# Patient Record
Sex: Female | Born: 1965
Health system: Southern US, Community
[De-identification: ages and names within clinical notes are randomized; demographics above are authoritative.]

## PROBLEM LIST (undated history)

## (undated) DIAGNOSIS — Z9189 Other specified personal risk factors, not elsewhere classified: Secondary | ICD-10-CM

## (undated) DIAGNOSIS — H8109 Meniere's disease, unspecified ear: Secondary | ICD-10-CM

## (undated) DIAGNOSIS — E785 Hyperlipidemia, unspecified: Secondary | ICD-10-CM

## (undated) HISTORY — DX: Other specified personal risk factors, not elsewhere classified: Z91.89

## (undated) HISTORY — PX: BUNIONECTOMY: SHX129

## (undated) HISTORY — DX: Meniere's disease, unspecified ear: H81.09

## (undated) HISTORY — DX: Hyperlipidemia, unspecified: E78.5

## (undated) HISTORY — PX: CERVICAL CERCLAGE: SHX1329

---

## 1984-03-02 HISTORY — PX: OVARY SURGERY: SHX727

## 1997-08-02 ENCOUNTER — Other Ambulatory Visit: Admission: RE | Admit: 1997-08-02 | Discharge: 1997-08-02 | Payer: Self-pay | Admitting: Obstetrics and Gynecology

## 1998-07-25 ENCOUNTER — Ambulatory Visit (HOSPITAL_BASED_OUTPATIENT_CLINIC_OR_DEPARTMENT_OTHER): Admission: RE | Admit: 1998-07-25 | Discharge: 1998-07-25 | Payer: Self-pay | Admitting: Orthopedic Surgery

## 1998-08-30 ENCOUNTER — Other Ambulatory Visit: Admission: RE | Admit: 1998-08-30 | Discharge: 1998-08-30 | Payer: Self-pay | Admitting: Obstetrics and Gynecology

## 1998-10-31 ENCOUNTER — Ambulatory Visit (HOSPITAL_BASED_OUTPATIENT_CLINIC_OR_DEPARTMENT_OTHER): Admission: RE | Admit: 1998-10-31 | Discharge: 1998-10-31 | Payer: Self-pay | Admitting: Orthopaedic Surgery

## 1999-07-15 ENCOUNTER — Encounter: Payer: Self-pay | Admitting: Obstetrics and Gynecology

## 1999-07-15 ENCOUNTER — Ambulatory Visit (HOSPITAL_COMMUNITY): Admission: RE | Admit: 1999-07-15 | Discharge: 1999-07-15 | Payer: Self-pay | Admitting: Obstetrics and Gynecology

## 1999-09-22 ENCOUNTER — Other Ambulatory Visit: Admission: RE | Admit: 1999-09-22 | Discharge: 1999-09-22 | Payer: Self-pay | Admitting: Obstetrics and Gynecology

## 2000-11-12 ENCOUNTER — Other Ambulatory Visit: Admission: RE | Admit: 2000-11-12 | Discharge: 2000-11-12 | Payer: Self-pay | Admitting: Internal Medicine

## 2005-07-07 ENCOUNTER — Other Ambulatory Visit: Admission: RE | Admit: 2005-07-07 | Discharge: 2005-07-07 | Payer: Self-pay | Admitting: Internal Medicine

## 2005-11-09 ENCOUNTER — Encounter: Admission: RE | Admit: 2005-11-09 | Discharge: 2005-11-09 | Payer: Self-pay | Admitting: Internal Medicine

## 2006-07-23 ENCOUNTER — Other Ambulatory Visit: Admission: RE | Admit: 2006-07-23 | Discharge: 2006-07-23 | Payer: Self-pay | Admitting: Family Medicine

## 2006-11-23 ENCOUNTER — Encounter: Admission: RE | Admit: 2006-11-23 | Discharge: 2006-11-23 | Payer: Self-pay | Admitting: Family Medicine

## 2007-07-27 ENCOUNTER — Other Ambulatory Visit: Admission: RE | Admit: 2007-07-27 | Discharge: 2007-07-27 | Payer: Self-pay | Admitting: Family Medicine

## 2008-06-18 ENCOUNTER — Encounter: Admission: RE | Admit: 2008-06-18 | Discharge: 2008-06-18 | Payer: Self-pay | Admitting: Family Medicine

## 2008-08-03 ENCOUNTER — Other Ambulatory Visit: Admission: RE | Admit: 2008-08-03 | Discharge: 2008-08-03 | Payer: Self-pay | Admitting: Family Medicine

## 2009-11-15 ENCOUNTER — Encounter: Admission: RE | Admit: 2009-11-15 | Discharge: 2009-11-15 | Payer: Self-pay | Admitting: Family Medicine

## 2009-12-25 ENCOUNTER — Other Ambulatory Visit: Admission: RE | Admit: 2009-12-25 | Discharge: 2009-12-25 | Payer: Self-pay | Admitting: Family Medicine

## 2011-04-07 ENCOUNTER — Other Ambulatory Visit: Payer: Self-pay | Admitting: Family Medicine

## 2011-04-07 DIAGNOSIS — Z1231 Encounter for screening mammogram for malignant neoplasm of breast: Secondary | ICD-10-CM

## 2011-04-17 ENCOUNTER — Ambulatory Visit
Admission: RE | Admit: 2011-04-17 | Discharge: 2011-04-17 | Disposition: A | Discharge status: Home / Self Care | Payer: BC Managed Care – PPO | Source: Ambulatory Visit | Attending: Family Medicine | Admitting: Family Medicine

## 2011-04-17 DIAGNOSIS — Z1231 Encounter for screening mammogram for malignant neoplasm of breast: Secondary | ICD-10-CM

## 2011-07-17 ENCOUNTER — Other Ambulatory Visit: Payer: Self-pay | Admitting: Family Medicine

## 2011-07-17 ENCOUNTER — Other Ambulatory Visit (HOSPITAL_COMMUNITY)
Admission: RE | Admit: 2011-07-17 | Discharge: 2011-07-17 | Disposition: A | Discharge status: Home / Self Care | Payer: BC Managed Care – PPO | Source: Ambulatory Visit | Attending: Family Medicine | Admitting: Family Medicine

## 2011-07-17 DIAGNOSIS — Z124 Encounter for screening for malignant neoplasm of cervix: Secondary | ICD-10-CM | POA: Insufficient documentation

## 2012-09-27 ENCOUNTER — Other Ambulatory Visit: Payer: Self-pay

## 2012-09-27 DIAGNOSIS — Z1231 Encounter for screening mammogram for malignant neoplasm of breast: Secondary | ICD-10-CM

## 2012-10-19 ENCOUNTER — Ambulatory Visit
Admission: RE | Admit: 2012-10-19 | Discharge: 2012-10-19 | Disposition: A | Discharge status: Home / Self Care | Payer: BC Managed Care – PPO | Source: Ambulatory Visit

## 2012-10-19 DIAGNOSIS — Z1231 Encounter for screening mammogram for malignant neoplasm of breast: Secondary | ICD-10-CM

## 2014-09-19 ENCOUNTER — Ambulatory Visit (INDEPENDENT_AMBULATORY_CARE_PROVIDER_SITE_OTHER): Payer: BLUE CROSS/BLUE SHIELD | Admitting: Family Medicine

## 2014-09-19 ENCOUNTER — Encounter: Payer: Self-pay | Admitting: Family Medicine

## 2014-09-19 ENCOUNTER — Encounter: Payer: Self-pay | Admitting: Internal Medicine

## 2014-09-19 ENCOUNTER — Encounter (INDEPENDENT_AMBULATORY_CARE_PROVIDER_SITE_OTHER): Payer: Self-pay

## 2014-09-19 VITALS — BP 114/68 | HR 72 | Temp 97.8°F | Ht 64.0 in | Wt 173.5 lb

## 2014-09-19 DIAGNOSIS — Z1211 Encounter for screening for malignant neoplasm of colon: Secondary | ICD-10-CM

## 2014-09-19 DIAGNOSIS — K5900 Constipation, unspecified: Secondary | ICD-10-CM | POA: Insufficient documentation

## 2014-09-19 DIAGNOSIS — Z0000 Encounter for general adult medical examination without abnormal findings: Secondary | ICD-10-CM | POA: Insufficient documentation

## 2014-09-19 DIAGNOSIS — Z1283 Encounter for screening for malignant neoplasm of skin: Secondary | ICD-10-CM

## 2014-09-19 DIAGNOSIS — H8109 Meniere's disease, unspecified ear: Secondary | ICD-10-CM | POA: Insufficient documentation

## 2014-09-19 DIAGNOSIS — H8102 Meniere's disease, left ear: Secondary | ICD-10-CM

## 2014-09-19 DIAGNOSIS — Z1239 Encounter for other screening for malignant neoplasm of breast: Secondary | ICD-10-CM

## 2014-09-19 DIAGNOSIS — Z9189 Other specified personal risk factors, not elsewhere classified: Secondary | ICD-10-CM | POA: Insufficient documentation

## 2014-09-19 DIAGNOSIS — H8103 Meniere's disease, bilateral: Secondary | ICD-10-CM | POA: Insufficient documentation

## 2014-09-19 LAB — LIPID PANEL
Cholesterol: 248 mg/dL — ABNORMAL HIGH (ref 0–200)
HDL: 56.3 mg/dL (ref >39.00)
LDL Cholesterol: 165 mg/dL — ABNORMAL HIGH (ref 0–99)
NonHDL: 191.7
Total CHOL/HDL Ratio: 4
Triglycerides: 134 mg/dL (ref 0.0–149.0)
VLDL: 26.8 mg/dL (ref 0.0–40.0)

## 2014-09-19 LAB — CBC WITH DIFFERENTIAL/PLATELET
Basophils Absolute: 0 10*3/uL (ref 0.0–0.1)
Basophils Relative: 0.7 % (ref 0.0–3.0)
Eosinophils Absolute: 0.1 10*3/uL (ref 0.0–0.7)
Eosinophils Relative: 1.4 % (ref 0.0–5.0)
HCT: 41.3 % (ref 36.0–46.0)
Hemoglobin: 13.9 g/dL (ref 12.0–15.0)
Lymphocytes Relative: 39.4 % (ref 12.0–46.0)
Lymphs Abs: 2 10*3/uL (ref 0.7–4.0)
MCHC: 33.7 g/dL (ref 30.0–36.0)
MCV: 88 fl (ref 78.0–100.0)
Monocytes Absolute: 0.4 10*3/uL (ref 0.1–1.0)
Monocytes Relative: 8.4 % (ref 3.0–12.0)
Neutro Abs: 2.5 10*3/uL (ref 1.4–7.7)
Neutrophils Relative %: 50.1 % (ref 43.0–77.0)
Platelets: 217 10*3/uL (ref 150.0–400.0)
RBC: 4.7 Mil/uL (ref 3.87–5.11)
RDW: 13.4 % (ref 11.5–15.5)
WBC: 5 10*3/uL (ref 4.0–10.5)

## 2014-09-19 LAB — COMPREHENSIVE METABOLIC PANEL
ALT: 12 U/L (ref 0–35)
AST: 18 U/L (ref 0–37)
Albumin: 4.6 g/dL (ref 3.5–5.2)
Alkaline Phosphatase: 68 U/L (ref 39–117)
BUN: 12 mg/dL (ref 6–23)
CO2: 30 mEq/L (ref 19–32)
Calcium: 9.8 mg/dL (ref 8.4–10.5)
Chloride: 102 mEq/L (ref 96–112)
Creatinine, Ser: 0.79 mg/dL (ref 0.40–1.20)
GFR: 81.38 mL/min (ref >60.00)
Glucose, Bld: 68 mg/dL — ABNORMAL LOW (ref 70–99)
Potassium: 4.3 mEq/L (ref 3.5–5.1)
Sodium: 139 mEq/L (ref 135–145)
Total Bilirubin: 0.6 mg/dL (ref 0.2–1.2)
Total Protein: 7.4 g/dL (ref 6.0–8.3)

## 2014-09-19 LAB — TSH: TSH: 1.31 u[IU]/mL (ref 0.35–4.50)

## 2014-09-19 NOTE — Progress Notes (Signed)
Subjective:   Patient ID: Tonya Anderson, female    DOB: 03/20/1965, 49 y.o.   MRN: [id number]  Tonya Anderson is a pleasant 49 y.o. year old female who presents to clinic today with Establish Care  on 09/19/2014  HPI:  G2P2 Would like CPX today.  LMP 5 years ago- no h/o post menopausal bleeding.  No family history of breast or uterine cancer. Has never had a colonoscopy.  Denies changes in her bowel habits or blood in her stool.  Menire's disease- takes a diuretic twice weekly to help with fluid build up behind her left ear and dizzy spells.  Denies any recent dizzy spells or syncope.  Physically active- [facility] that [facility]- bikes, swims, walks. No current outpatient prescriptions on file prior to visit.   No current facility-administered medications on file prior to visit.    Allergies  Allergen Reactions  . Ancef [Cefazolin] Anaphylaxis    Past Medical History  Diagnosis Date  . Arthritis   . Blood in stool   . History of fainting spells of unknown cause   . Constipation   . Hemorrhoids     Past Surgical History  Procedure Laterality Date  . Cervical cerclage  1994 and 1995  . Ovary surgery  1986    Family History  Problem Relation Age of Onset  . Arthritis Mother   . Hyperlipidemia Father   . Depression Father   . Arthritis Maternal Grandmother   . Heart disease Paternal Grandfather     History   Social History  . Marital Status: Married    Spouse Name: N/A  . Number of Children: N/A  . Years of Education: N/A   Occupational History  . Not on file.   Social History Main Topics  . Smoking status: Never Smoker   . Smokeless tobacco: Never Used  . Alcohol Use: Yes  . Drug Use: No  . Sexual Activity: Yes     Comment: husband has vasectomy   Other Topics Concern  . 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    Review of Systems  Constitutional: Negative.   HENT: Negative.   Eyes: Negative.   Respiratory: Negative.   Cardiovascular: Negative.   Gastrointestinal: Negative.   Endocrine: Negative.   Genitourinary: Negative.   Musculoskeletal: Negative.   Skin: Negative.   Allergic/Immunologic: Negative.   Neurological: Negative.   Hematological: Negative.   Psychiatric/Behavioral: Negative.   All other systems reviewed and are negative.      Objective:    BP 114/68 mmHg  Pulse 72  Temp(Src) 97.8 F (36.6 C) (Oral)  Ht  (1.626 m)  Wt 173 lb 8 oz (78.699 kg)  BMI 29.77 kg/m2  SpO2 97%   Physical Exam    General:  Well-developed,well-nourished,in no acute distress; alert,appropriate and cooperative throughout examination Head:  normocephalic and atraumatic.   Eyes:  vision grossly intact, pupils equal, pupils round, and pupils reactive to light.   Ears:  R ear normal and L ear normal.   Nose:  no external deformity.   Mouth:  good dentition.   Neck:  No deformities, masses, or tenderness noted. Breasts:  No mass, nodules, thickening, tenderness, bulging, retraction, inflamation, nipple discharge or skin changes noted.   Lungs:  Normal respiratory effort, chest expands symmetrically.  Lungs are clear to auscultation, no crackles or wheezes. Heart:  Normal rate and regular rhythm. S1 and S2 normal without gallop, murmur, click, rub or other extra sounds. Abdomen:  Bowel sounds positive,abdomen soft and non-tender without masses, organomegaly or hernias noted. Msk:  No deformity or scoliosis noted of thoracic or lumbar spine.   Extremities:  No clubbing, cyanosis, edema, or deformity noted with normal full range of motion of all joints.   Neurologic:  alert & oriented X3 and gait normal.   Skin:  Intact without suspicious lesions or rashes Cervical Nodes:  No lymphadenopathy noted Axillary Nodes:  No  palpable lymphadenopathy Psych:  Cognition and judgment appear intact. Alert and cooperative with normal attention span and concentration. No apparent delusions, illusions, hallucinations      Assessment & Plan:   Well woman exam (no gynecological exam) - Plan: CBC with Differential/Platelet, Comprehensive metabolic panel, Lipid panel, TSH  DES exposure in utero  Meniere's disease, left  Constipation, unspecified constipation type  Special screening for malignant neoplasms, colon - Plan: Ambulatory referral to Gastroenterology  Screening for skin cancer - Plan: Ambulatory referral to Dermatology  Screening for breast cancer - Plan: MM Digital Screening No Follow-up on file.

## 2014-09-19 NOTE — Patient Instructions (Signed)
It was really nice to meet you. We will call you with your results and you can view them online.  Please call to set up your mammogram.  Please stop by to see Shirlee LimerickMarion on your way out.

## 2014-09-19 NOTE — Progress Notes (Signed)
Pre visit review using our clinic review tool, if applicable. No additional management support is needed unless otherwise documented below in the visit note. 

## 2014-09-19 NOTE — Assessment & Plan Note (Signed)
Reviewed preventive care protocols, scheduled due services, and updated immunizations Discussed nutrition, exercise, diet, and healthy lifestyle.  Orders Placed This Encounter  Procedures  . MM Digital Screening  . CBC with Differential/Platelet  . Comprehensive metabolic panel  . Lipid panel  . TSH  . Ambulatory referral to Gastroenterology  . Ambulatory referral to Dermatology

## 2014-09-26 ENCOUNTER — Other Ambulatory Visit: Payer: Self-pay | Admitting: Family Medicine

## 2014-09-26 MED ORDER — ATORVASTATIN CALCIUM 10 MG PO TABS: 10.0000 mg | ORAL_TABLET | Freq: Every day | ORAL | Status: DC

## 2014-10-30 ENCOUNTER — Ambulatory Visit
Admission: RE | Admit: 2014-10-30 | Discharge: 2014-10-30 | Disposition: A | Discharge status: Home / Self Care | Payer: BLUE CROSS/BLUE SHIELD | Source: Ambulatory Visit | Attending: Family Medicine | Admitting: Family Medicine

## 2014-10-30 DIAGNOSIS — Z1239 Encounter for other screening for malignant neoplasm of breast: Secondary | ICD-10-CM

## 2014-11-06 ENCOUNTER — Encounter: Payer: BLUE CROSS/BLUE SHIELD | Admitting: Internal Medicine

## 2014-11-27 ENCOUNTER — Other Ambulatory Visit: Payer: Self-pay | Admitting: Family Medicine

## 2014-11-27 ENCOUNTER — Other Ambulatory Visit (INDEPENDENT_AMBULATORY_CARE_PROVIDER_SITE_OTHER): Payer: BLUE CROSS/BLUE SHIELD

## 2014-11-27 DIAGNOSIS — Z23 Encounter for immunization: Secondary | ICD-10-CM

## 2014-11-27 DIAGNOSIS — Z0000 Encounter for general adult medical examination without abnormal findings: Secondary | ICD-10-CM

## 2014-11-27 LAB — LIPID PANEL
Cholesterol: 193 mg/dL (ref 0–200)
HDL: 61.6 mg/dL (ref >39.00)
LDL Cholesterol: 103 mg/dL — ABNORMAL HIGH (ref 0–99)
NonHDL: 131.41
Total CHOL/HDL Ratio: 3
Triglycerides: 141 mg/dL (ref 0.0–149.0)
VLDL: 28.2 mg/dL (ref 0.0–40.0)

## 2014-11-27 LAB — CBC WITH DIFFERENTIAL/PLATELET
Basophils Absolute: 0 10*3/uL (ref 0.0–0.1)
Basophils Relative: 0.4 % (ref 0.0–3.0)
Eosinophils Absolute: 0.1 10*3/uL (ref 0.0–0.7)
Eosinophils Relative: 1.8 % (ref 0.0–5.0)
HCT: 43.5 % (ref 36.0–46.0)
Hemoglobin: 14.6 g/dL (ref 12.0–15.0)
Lymphocytes Relative: 35.3 % (ref 12.0–46.0)
Lymphs Abs: 2.1 10*3/uL (ref 0.7–4.0)
MCHC: 33.5 g/dL (ref 30.0–36.0)
MCV: 88.8 fl (ref 78.0–100.0)
Monocytes Absolute: 0.6 10*3/uL (ref 0.1–1.0)
Monocytes Relative: 9.5 % (ref 3.0–12.0)
Neutro Abs: 3.1 10*3/uL (ref 1.4–7.7)
Neutrophils Relative %: 53 % (ref 43.0–77.0)
Platelets: 214 10*3/uL (ref 150.0–400.0)
RBC: 4.9 Mil/uL (ref 3.87–5.11)
RDW: 13.1 % (ref 11.5–15.5)
WBC: 5.8 10*3/uL (ref 4.0–10.5)

## 2014-11-27 LAB — COMPREHENSIVE METABOLIC PANEL
ALT: 15 U/L (ref 0–35)
AST: 17 U/L (ref 0–37)
Albumin: 4.7 g/dL (ref 3.5–5.2)
Alkaline Phosphatase: 74 U/L (ref 39–117)
BUN: 11 mg/dL (ref 6–23)
CO2: 32 mEq/L (ref 19–32)
Calcium: 10 mg/dL (ref 8.4–10.5)
Chloride: 101 mEq/L (ref 96–112)
Creatinine, Ser: 0.82 mg/dL (ref 0.40–1.20)
GFR: 77.9 mL/min (ref >60.00)
Glucose, Bld: 98 mg/dL (ref 70–99)
Potassium: 4.4 mEq/L (ref 3.5–5.1)
Sodium: 140 mEq/L (ref 135–145)
Total Bilirubin: 0.7 mg/dL (ref 0.2–1.2)
Total Protein: 7.2 g/dL (ref 6.0–8.3)

## 2014-11-27 LAB — TSH: TSH: 1.83 u[IU]/mL (ref 0.35–4.50)

## 2014-11-28 LAB — HIV ANTIBODY (ROUTINE TESTING W REFLEX): HIV 1&2 Ab, 4th Generation: NONREACTIVE

## 2014-11-28 LAB — HEPATITIS C ANTIBODY: HCV Ab: NEGATIVE

## 2014-12-12 ENCOUNTER — Encounter: Payer: BLUE CROSS/BLUE SHIELD | Admitting: Internal Medicine

## 2015-01-04 ENCOUNTER — Ambulatory Visit (AMBULATORY_SURGERY_CENTER): Payer: Self-pay | Admitting: *Deleted

## 2015-01-04 VITALS — Ht 64.0 in | Wt 176.0 lb

## 2015-01-04 DIAGNOSIS — Z1211 Encounter for screening for malignant neoplasm of colon: Secondary | ICD-10-CM

## 2015-01-04 NOTE — Progress Notes (Signed)
No egg or soy allergy. No anesthesia problems.  No home O2.  No diet meds.  

## 2015-01-11 ENCOUNTER — Encounter: Payer: Self-pay | Admitting: Internal Medicine

## 2015-01-16 ENCOUNTER — Telehealth: Payer: Self-pay | Admitting: Family Medicine

## 2015-01-16 NOTE — Telephone Encounter (Signed)
Labs indicate they were to be discussed at CPE. Spoke to pt who states she had CPE in July, and that these were repeat labs that she had not be advised on. Apologized and pt verbally expressed understanding

## 2015-01-16 NOTE — Telephone Encounter (Signed)
Pt came in 11/27/2014 for lab work and has not heard back from anyone w/results.  She was supposed to have her lipids checked after being on Lipitor and wants to know if when it's time to refill it, does she stay w/the same dosage, etc.  Pt is requesting c/b. Thank you

## 2015-01-16 NOTE — Telephone Encounter (Signed)
Please apologize to pt.  Her cholesterol looked great.  Yes, continue same dosage.

## 2015-01-21 ENCOUNTER — Encounter: Payer: Self-pay | Admitting: Internal Medicine

## 2015-01-21 ENCOUNTER — Ambulatory Visit (AMBULATORY_SURGERY_CENTER): Payer: BLUE CROSS/BLUE SHIELD | Admitting: Internal Medicine

## 2015-01-21 VITALS — BP 115/84 | HR 56 | Temp 96.7°F | Resp 22 | Ht 64.0 in | Wt 176.0 lb

## 2015-01-21 DIAGNOSIS — Z1211 Encounter for screening for malignant neoplasm of colon: Secondary | ICD-10-CM

## 2015-01-21 MED ORDER — SODIUM CHLORIDE 0.9 % IV SOLN: 500.0000 mL | INTRAVENOUS | Status: DC

## 2015-01-21 NOTE — Op Note (Signed)
[facility] [address] [place], [id number]   COLONOSCOPY PROCEDURE REPORT  PATIENT: Tonya Anderson, Tonya Anderson  MR#: [id number] BIRTHDATE: 24-Jun-1965 , 49  yrs. old GENDER: female ENDOSCOPIST: Iva Booparl E Gessner, MD, [facility] PROCEDURE DATE:  01/21/2015 PROCEDURE:   Colonoscopy, screening First Screening Colonoscopy - Avg.  risk and is 50 yrs.  old or older Yes.  Prior Negative Screening - Now for repeat screening. N/A  History of Adenoma - Now for follow-up colonoscopy & has been > or = to 3 yrs.  N/A  Polyps removed today? No Recommend repeat exam, <10 yrs? No ASA CLASS:   Class II INDICATIONS:Screening for colonic neoplasia and Colorectal Neoplasm Risk Assessment for this procedure is average risk. MEDICATIONS: Propofol 200 mg IV and Monitored anesthesia care  DESCRIPTION OF PROCEDURE:   After the risks benefits and alternatives of the procedure were thoroughly explained, informed consent was obtained.  The digital rectal exam revealed no abnormalities of the rectum.   The LB CF-H180AL Loaner [id number] endoscope was introduced through the anus and advanced to the cecum, which was identified by both the appendix and ileocecal valve. No adverse events experienced.   The quality of the prep was excellent.  (MiraLax was used)  The instrument was then slowly withdrawn as the colon was fully examined. Estimated blood loss is zero unless otherwise noted in this procedure report.      COLON FINDINGS: A normal appearing cecum, ileocecal valve, and appendiceal orifice were identified.  The ascending, transverse, descending, sigmoid colon, and rectum appeared unremarkable. Retroflexed views revealed no abnormalities. The time to cecum = 2.2 Withdrawal time = 7.1   The scope was withdrawn and the procedure completed. COMPLICATIONS: There were no immediate complications.  ENDOSCOPIC IMPRESSION: Normal colonoscopy - excellent prep - first screening  RECOMMENDATIONS: Repeat  colonoscopy/screening test in 2026 -  10 years.  eSigned:  Iva Booparl E Gessner, MD, Clement[facility] GrahamFACG 01/21/2015 1:28 PM   cc: Ruthe Mannanalia Aron, MD and The Patient

## 2015-01-21 NOTE — Patient Instructions (Addendum)
 Your colonoscopy was normal.  Next routine colonoscopy/screening test in 10 years - 2026  I appreciate the opportunity to care for you. Dayzee Trower E. Jeffie Spivack, MD, FACG   YOU HAD AN ENDOSCOPIC PROCEDURE TODAY AT THE [facility]:   Refer to the procedure report that was given to you for any specific questions about what was found during the examination.  If the procedure report does not answer your questions, please call your gastroenterologist to clarify.  If you requested that your care partner not be given the details of your procedure findings, then the procedure report has been included in a sealed envelope for you to review at your convenience later.  YOU SHOULD EXPECT: Some feelings of bloating in the abdomen. Passage of more gas than usual.  Walking can help get rid of the air that was put into your GI tract during the procedure and reduce the bloating. If you had a lower endoscopy (such as a colonoscopy or flexible sigmoidoscopy) you may notice spotting of blood in your stool or on the toilet paper. If you underwent a bowel prep for your procedure, you may not have a normal bowel movement for a few days.  Please Note:  You might notice some irritation and congestion in your nose or some drainage.  This is from the oxygen used during your procedure.  There is no need for concern and it should clear up in a day or so.  SYMPTOMS TO REPORT IMMEDIATELY:   Following lower endoscopy (colonoscopy or flexible sigmoidoscopy):  Excessive amounts of blood in the stool  Significant tenderness or worsening of abdominal pains  Swelling of the abdomen that is new, acute  Fever of 100F or higher   For urgent or emergent issues, a gastroenterologist can be reached at any hour by calling [phone].   DIET: Your first meal following the procedure should be a small meal and then it is ok to progress to your normal diet. Heavy or fried foods are harder to digest and may make you  feel nauseous or bloated.  Likewise, meals heavy in dairy and vegetables can increase bloating.  Drink plenty of fluids but you should avoid alcoholic beverages for 24 hours.  ACTIVITY:  You should plan to take it easy for the rest of today and you should NOT DRIVE or use heavy machinery until tomorrow (because of the sedation medicines used during the test).    FOLLOW UP: Our staff will call the number listed on your records the next business day following your procedure to check on you and address any questions or concerns that you may have regarding the information given to you following your procedure. If we do not reach you, we will leave a message.  However, if you are feeling well and you are not experiencing any problems, there is no need to return our call.  We will assume that you have returned to your regular daily activities without incident.  If any biopsies were taken you will be contacted by phone or by letter within the next 1-3 weeks.  Please call us at [phone] if you have not heard about the biopsies in 3 weeks.    SIGNATURES/CONFIDENTIALITY: You and/or your care partner have signed paperwork which will be entered into your electronic medical record.  These signatures attest to the fact that that the information above on your After Visit Summary has been reviewed and is understood.  Full responsibility of the confidentiality of this discharge information   lies with you and/or your care-partner.

## 2015-01-21 NOTE — Progress Notes (Signed)
To recovery, report to [place], RN, VSS 

## 2015-01-22 ENCOUNTER — Telehealth: Payer: Self-pay | Admitting: *Deleted

## 2015-01-22 NOTE — Telephone Encounter (Signed)
Name identifier, left message, follow-up 

## 2015-02-27 ENCOUNTER — Other Ambulatory Visit: Payer: Self-pay | Admitting: Family Medicine

## 2015-04-25 ENCOUNTER — Telehealth: Payer: Self-pay | Admitting: Family Medicine

## 2015-04-25 NOTE — Telephone Encounter (Signed)
Pt need of copy of her immunization [place] going [place] Please call pt when ready for pick up

## 2015-04-25 NOTE — Telephone Encounter (Signed)
Spoke to pt and advised we have not given any immunizations, outside of the flu shot. Pt states she will contact her previous provider at [facility]

## 2015-05-16 ENCOUNTER — Other Ambulatory Visit: Payer: Self-pay | Admitting: Internal Medicine

## 2015-05-16 ENCOUNTER — Telehealth: Payer: Self-pay | Admitting: Family Medicine

## 2015-05-16 MED ORDER — ONDANSETRON HCL 4 MG PO TABS: 4.0000 mg | ORAL_TABLET | Freq: Three times a day (TID) | ORAL | Status: DC | PRN

## 2015-05-16 NOTE — Telephone Encounter (Signed)
zofran sent to pharmacy. 

## 2015-05-16 NOTE — Telephone Encounter (Signed)
[place] to [place].  Pt has had all shots from the health department and got a rx from them for Cipro.  She has now been advised to get a rx for Zofran ODT to take with her to combat nausea from anything she may eat while she is there that may not sit well with her.  [facility] [phone]

## 2015-09-13 ENCOUNTER — Other Ambulatory Visit: Payer: Self-pay | Admitting: Family Medicine

## 2015-09-13 DIAGNOSIS — Z0000 Encounter for general adult medical examination without abnormal findings: Secondary | ICD-10-CM

## 2015-09-18 ENCOUNTER — Ambulatory Visit (INDEPENDENT_AMBULATORY_CARE_PROVIDER_SITE_OTHER): Payer: BLUE CROSS/BLUE SHIELD | Admitting: Primary Care

## 2015-09-18 ENCOUNTER — Encounter: Payer: Self-pay | Admitting: Primary Care

## 2015-09-18 VITALS — BP 118/76 | HR 82 | Temp 98.1°F | Ht 64.0 in | Wt 178.4 lb

## 2015-09-18 DIAGNOSIS — R05 Cough: Secondary | ICD-10-CM | POA: Diagnosis not present

## 2015-09-18 DIAGNOSIS — R059 Cough, unspecified: Secondary | ICD-10-CM

## 2015-09-18 MED ORDER — AZITHROMYCIN 250 MG PO TABS: ORAL_TABLET | ORAL | Status: DC

## 2015-09-18 NOTE — Patient Instructions (Signed)
Start Azithromycin antibiotics. Take 2 tablets by mouth today, then 1 tablet daily for 4 additional days.  Cough/Congestion: Try taking Mucinex DM. This will help loosen up the mucous in your chest. Ensure you take this medication with a full glass of water.  Ensure you are staying hydrated with water.   It was a pleasure meeting you!

## 2015-09-18 NOTE — Progress Notes (Signed)
Subjective:    Patient ID: Tonya Anderson, female    DOB: [birth year], 50 y.o.   MRN: [id number]  HPI  Ms. Tonya Anderson is a 50 year old female who presents today with a chief complaint of cough. She also reports chest congestion, nasal congestion, wheezing, shortness of breath, fatigue. Her cough is productive with green sputum that has improved overall. Denies fevers, rhinorrhea, ear pain, sore throat. Her symptoms have been present persistently for 3-4 weeks. She felt better 2 weeks ago, but then over this past weekend she began noticing fatigue and increased shortness of breath with cough. She's taken Robitussin OTC with temporary improvement. She has been around several friends with the same symptoms.  Review of Systems  Constitutional: Positive for fatigue. Negative for fever.  HENT: Positive for congestion. Negative for ear pain, postnasal drip, sinus pressure and sore throat.   Respiratory: Positive for cough, shortness of breath and wheezing.   Cardiovascular: Negative for chest pain.       Past Medical History  Diagnosis Date  . Arthritis   . Blood in stool   . History of fainting spells of unknown cause   . Constipation   . Hemorrhoids   . Hyperlipidemia   . Meniere disease      Social History   Social History  . Marital Status: Married    Spouse Name: N/A  . Number of Children: N/A  . Years of Education: N/A   Occupational History  . Not on file.   Social History Main Topics  . Smoking status: Never Smoker   . Smokeless tobacco: Never Used  . Alcohol Use: 0.0 oz/week    0 Standard drinks or equivalent per week     Comment:  4-6/week beer/wine/vodka  . Drug Use: No  . Sexual Activity: Yes     Comment: husband has vasectomy   Other Topics Concern  . 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 Cervical cerclage  1994 and 1995  . Ovary surgery  1986    removed cyst and ovary  . Bunionectomy      Family History  Problem Relation Age of Onset  . Arthritis Mother   . Hyperlipidemia Father   . Depression Father   . Arthritis Maternal Grandmother   . Heart disease Paternal Grandfather   . Colon cancer Neg Hx     Allergies  Allergen Reactions  . Ancef [Cefazolin] Anaphylaxis    Current Outpatient Prescriptions on File Prior to Visit  Medication Sig Dispense Refill  . triamterene-hydrochlorothiazide (MAXZIDE-25) 37.5-25 MG per tablet Take 1 tablet by mouth 2 (two) times a week. Reported on 09/18/2015    . atorvastatin (LIPITOR) 10 MG tablet TAKE 1 TABLET BY MOUTH DAILY (Patient not taking: Reported on 09/18/2015) 30 tablet 8   No current facility-administered medications on file prior to visit.    BP 118/76 mmHg  Pulse 82  Temp(Src) 98.1 F (36.7 C) (Oral)  Ht 5\' 4"  (1.626 m)  Wt 178 lb 6.4 oz (80.922 kg)  BMI 30.61 kg/m2  SpO2 97%    Objective:   Physical Exam  Constitutional: She appears well-nourished.  HENT:  Right Ear: Tympanic membrane and ear canal normal.  Left Ear: Tympanic membrane and ear canal normal.  Nose: Right sinus exhibits no maxillary sinus tenderness and no frontal sinus tenderness. Left sinus exhibits no maxillary sinus tenderness and no  frontal sinus tenderness.  Mouth/Throat: Oropharynx is clear and moist.  Eyes: Conjunctivae are normal.  Neck: Neck supple.  Cardiovascular: Normal rate and regular rhythm.   Pulmonary/Chest: Effort normal. She has no wheezes. She has rhonchi in the right upper field, the right lower field, the left upper field and the left lower field.  Lymphadenopathy:    She has no cervical adenopathy.  Skin: Skin is warm and dry.          Assessment & Plan:  Acute Bronchitis:  Cough, chest congestion x 4 weeks. Green sputum production. Now feeling worse with fatigue, shortness of breath. Exam with mild rhonchi throughout lung fields, no wheezing. Doesn't appear toxic,  but does appear to be feeling fatigued. Given duration of symptoms with increased fatigue, will treat for presumed bacterial involvement. Rx for Zpak sent to pharmacy. Discussed use of Mucinex with water consumption. She will follow up with PCP in 1 week for annual physical. Push fluids, return precautions provided.  Morrie Sheldon, NP

## 2015-09-18 NOTE — Progress Notes (Signed)
Pre visit review using our clinic review tool, if applicable. No additional management support is needed unless otherwise documented below in the visit note. 

## 2015-09-23 ENCOUNTER — Other Ambulatory Visit (INDEPENDENT_AMBULATORY_CARE_PROVIDER_SITE_OTHER): Payer: BLUE CROSS/BLUE SHIELD

## 2015-09-23 DIAGNOSIS — Z0000 Encounter for general adult medical examination without abnormal findings: Secondary | ICD-10-CM

## 2015-09-23 LAB — COMPREHENSIVE METABOLIC PANEL
ALT: 14 U/L (ref 0–35)
AST: 17 U/L (ref 0–37)
Albumin: 4.3 g/dL (ref 3.5–5.2)
Alkaline Phosphatase: 71 U/L (ref 39–117)
BUN: 13 mg/dL (ref 6–23)
CO2: 30 mEq/L (ref 19–32)
Calcium: 9.6 mg/dL (ref 8.4–10.5)
Chloride: 104 mEq/L (ref 96–112)
Creatinine, Ser: 0.79 mg/dL (ref 0.40–1.20)
GFR: 81.06 mL/min (ref >60.00)
Glucose, Bld: 88 mg/dL (ref 70–99)
Potassium: 4.4 mEq/L (ref 3.5–5.1)
Sodium: 138 mEq/L (ref 135–145)
Total Bilirubin: 0.5 mg/dL (ref 0.2–1.2)
Total Protein: 7.1 g/dL (ref 6.0–8.3)

## 2015-09-23 LAB — CBC WITH DIFFERENTIAL/PLATELET
Basophils Absolute: 0 10*3/uL (ref 0.0–0.1)
Basophils Relative: 0.6 % (ref 0.0–3.0)
Eosinophils Absolute: 0.1 10*3/uL (ref 0.0–0.7)
Eosinophils Relative: 2.2 % (ref 0.0–5.0)
HCT: 40.5 % (ref 36.0–46.0)
Hemoglobin: 13.6 g/dL (ref 12.0–15.0)
Lymphocytes Relative: 43.1 % (ref 12.0–46.0)
Lymphs Abs: 2.5 10*3/uL (ref 0.7–4.0)
MCHC: 33.7 g/dL (ref 30.0–36.0)
MCV: 87.3 fl (ref 78.0–100.0)
Monocytes Absolute: 0.5 10*3/uL (ref 0.1–1.0)
Monocytes Relative: 8.3 % (ref 3.0–12.0)
Neutro Abs: 2.7 10*3/uL (ref 1.4–7.7)
Neutrophils Relative %: 45.8 % (ref 43.0–77.0)
Platelets: 227 10*3/uL (ref 150.0–400.0)
RBC: 4.63 Mil/uL (ref 3.87–5.11)
RDW: 14 % (ref 11.5–15.5)
WBC: 5.8 10*3/uL (ref 4.0–10.5)

## 2015-09-23 LAB — LIPID PANEL
Cholesterol: 252 mg/dL — ABNORMAL HIGH (ref 0–200)
HDL: 54.5 mg/dL (ref >39.00)
LDL Cholesterol: 171 mg/dL — ABNORMAL HIGH (ref 0–99)
NonHDL: 197.34
Total CHOL/HDL Ratio: 5
Triglycerides: 131 mg/dL (ref 0.0–149.0)
VLDL: 26.2 mg/dL (ref 0.0–40.0)

## 2015-09-23 LAB — TSH: TSH: 1.92 u[IU]/mL (ref 0.35–4.50)

## 2015-09-30 ENCOUNTER — Encounter: Payer: Self-pay | Admitting: Family Medicine

## 2015-09-30 ENCOUNTER — Ambulatory Visit (INDEPENDENT_AMBULATORY_CARE_PROVIDER_SITE_OTHER): Payer: BLUE CROSS/BLUE SHIELD | Admitting: Family Medicine

## 2015-09-30 ENCOUNTER — Other Ambulatory Visit (HOSPITAL_COMMUNITY)
Admission: RE | Admit: 2015-09-30 | Discharge: 2015-09-30 | Disposition: A | Discharge status: Home / Self Care | Payer: BLUE CROSS/BLUE SHIELD | Source: Ambulatory Visit | Attending: Family Medicine | Admitting: Family Medicine

## 2015-09-30 VITALS — BP 116/72 | HR 62 | Temp 97.5°F | Ht 64.0 in | Wt 180.0 lb

## 2015-09-30 DIAGNOSIS — Z01419 Encounter for gynecological examination (general) (routine) without abnormal findings: Secondary | ICD-10-CM | POA: Insufficient documentation

## 2015-09-30 DIAGNOSIS — Z1151 Encounter for screening for human papillomavirus (HPV): Secondary | ICD-10-CM | POA: Insufficient documentation

## 2015-09-30 DIAGNOSIS — E785 Hyperlipidemia, unspecified: Secondary | ICD-10-CM | POA: Diagnosis not present

## 2015-09-30 DIAGNOSIS — Z0000 Encounter for general adult medical examination without abnormal findings: Secondary | ICD-10-CM | POA: Insufficient documentation

## 2015-09-30 MED ORDER — TRIAMTERENE-HCTZ 37.5-25 MG PO TABS: 1.0000 | ORAL_TABLET | ORAL | 2 refills | Status: DC

## 2015-09-30 MED ORDER — ATORVASTATIN CALCIUM 10 MG PO TABS: 10.0000 mg | ORAL_TABLET | Freq: Every day | ORAL | 2 refills | Status: DC

## 2015-09-30 NOTE — Assessment & Plan Note (Signed)
Reviewed preventive care protocols, scheduled due services, and updated immunizations Discussed nutrition, exercise, diet, and healthy lifestyle.  

## 2015-09-30 NOTE — Progress Notes (Signed)
Pre visit review using our clinic review tool, if applicable. No additional management support is needed unless otherwise documented below in the visit note. 

## 2015-09-30 NOTE — Progress Notes (Signed)
Subjective:   Patient ID: Tonya Anderson, female    DOB: 1965/12/26, 50 y.o.   MRN: [id number]  Tonya Anderson is a pleasant 50 y.o. year old female who presents to clinic today with Annual Exam (with pap) and Numbness (R hand)  on 09/30/2015  HPI:  G2P2  LMP 6+ years ago- no h/o post menopausal bleeding. No h/o abnormal pap smears.  Last pap 07/17/11. Mammogram 10/31/14   No family history of breast or uterine cancer. Colonoscopy 01/21/15  Menire's disease- takes a diuretic twice weekly to help with fluid build up behind her left ear and dizzy spells.  Denies any recent dizzy spells or syncope.  Physically active- does [facility] classes at that [facility]- bikes, swims, walks.  HLD- stopped taking lipitor for a month when she had a bad URI.   Lab Results  Component Value Date   CHOL 252 (H) 09/23/2015   HDL 54.50 09/23/2015   LDLCALC 171 (H) 09/23/2015   TRIG 131.0 09/23/2015   CHOLHDL 5 09/23/2015   Lab Results  Component Value Date   ALT 14 09/23/2015   AST 17 09/23/2015   ALKPHOS 71 09/23/2015   BILITOT 0.5 09/23/2015   Lab Results  Component Value Date   NA 138 09/23/2015   K 4.4 09/23/2015   CL 104 09/23/2015   CO2 30 09/23/2015   Lab Results  Component Value Date   TSH 1.92 09/23/2015   Lab Results  Component Value Date   CREATININE 0.79 09/23/2015   Lab Results  Component Value Date   NA 138 09/23/2015   K 4.4 09/23/2015   CL 104 09/23/2015   CO2 30 09/23/2015    No current outpatient prescriptions on file prior to visit.   No current facility-administered medications on file prior to visit.     Allergies  Allergen Reactions  . Ancef [Cefazolin] Anaphylaxis    Past Medical History:  Diagnosis Date  . Arthritis   . Blood in stool   . Constipation   . Hemorrhoids   . History of fainting spells of unknown cause   . Hyperlipidemia   . Meniere disease     Past Surgical History:  Procedure Laterality Date  . BUNIONECTOMY    . CERVICAL  CERCLAGE  1994 and 1995  . OVARY SURGERY  1986   removed cyst and ovary    Family History  Problem Relation Age of Onset  . Arthritis Mother   . Hyperlipidemia Father   . Depression Father   . Arthritis Maternal Grandmother   . Heart disease Paternal Grandfather   . Colon cancer Neg Hx     Social History   Social History  . Marital status: Married    Spouse name: N/A  . Number of children: N/A  . Years of education: N/A   Occupational History  . Not on file.   Social History Main Topics  . Smoking status: Never Smoker  . Smokeless tobacco: Never Used  . Alcohol use 0.0 oz/week     Comment:  4-6/week beer/wine/vodka  . Drug use: No  . Sexual activity: Yes     Comment: husband has vasectomy   Other Topics Concern  . 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    Review of Systems  Constitutional: Negative.   HENT: Negative.  Eyes: Negative.   Respiratory: Negative.   Cardiovascular: Negative.   Gastrointestinal: Negative.   Endocrine: Negative.   Genitourinary: Negative.   Musculoskeletal: Negative.   Skin: Negative.   Allergic/Immunologic: Negative.   Neurological: Negative.   Hematological: Negative.   Psychiatric/Behavioral: Negative.   All other systems reviewed and are negative.      Objective:    BP 116/72   Pulse 62   Temp 97.5 F (36.4 C) (Oral)   Ht  (1.626 m)   Wt 180 lb (81.6 kg)   SpO2 98%   BMI 30.90 kg/m    Physical Exam     General:  Well-developed,well-nourished,in no acute distress; alert,appropriate and cooperative throughout examination Head:  normocephalic and atraumatic.   Eyes:  vision grossly intact, pupils equal, pupils round, and pupils reactive to light.   Ears:  R ear normal and L ear normal.   Nose:  no external  deformity.   Mouth:  good dentition.   Neck:  No deformities, masses, or tenderness noted. Breasts:  No mass, nodules, thickening, tenderness, bulging, retraction, inflamation, nipple discharge or skin changes noted.   Lungs:  Normal respiratory effort, chest expands symmetrically. Lungs are clear to auscultation, no crackles or wheezes. Heart:  Normal rate and regular rhythm. S1 and S2 normal without gallop, murmur, click, rub or other extra sounds. Abdomen:  Bowel sounds positive,abdomen soft and non-tender without masses, organomegaly or hernias noted. Rectal:  no external abnormalities.   Genitalia:  Pelvic Exam:        External: normal female genitalia without lesions or masses        Vagina: normal without lesions or masses        Cervix: normal without lesions or masses        Adnexa: normal bimanual exam without masses or fullness        Uterus: normal by palpation        Pap smear: performed Msk:  No deformity or scoliosis noted of thoracic or lumbar spine.   Extremities:  No clubbing, cyanosis, edema, or deformity noted with normal full range of motion of all joints.   Neurologic:  alert & oriented X3 and gait normal.   Skin:  Intact without suspicious lesions or rashes Cervical Nodes:  No lymphadenopathy noted Axillary Nodes:  No palpable lymphadenopathy Psych:  Cognition and judgment appear intact. Alert and cooperative with normal attention span and concentration. No apparent delusions, illusions, hallucinations     Assessment & Plan:   Well woman exam with routine gynecological exam No Follow-up on file.

## 2015-09-30 NOTE — Assessment & Plan Note (Signed)
Deteriorated off statin. She has since restarted it.

## 2015-10-01 LAB — CYTOLOGY - PAP

## 2015-10-02 ENCOUNTER — Encounter: Payer: Self-pay | Admitting: *Deleted

## 2015-10-28 IMAGING — MG MM DIGITAL SCREENING BILAT W/ CAD
4 series · 4 of 4 positions shown · non-contrast
Comparison: Previous exam(s).

CLINICAL DATA: Screening.

EXAM:
DIGITAL SCREENING BILATERAL MAMMOGRAM WITH CAD

[R CC]
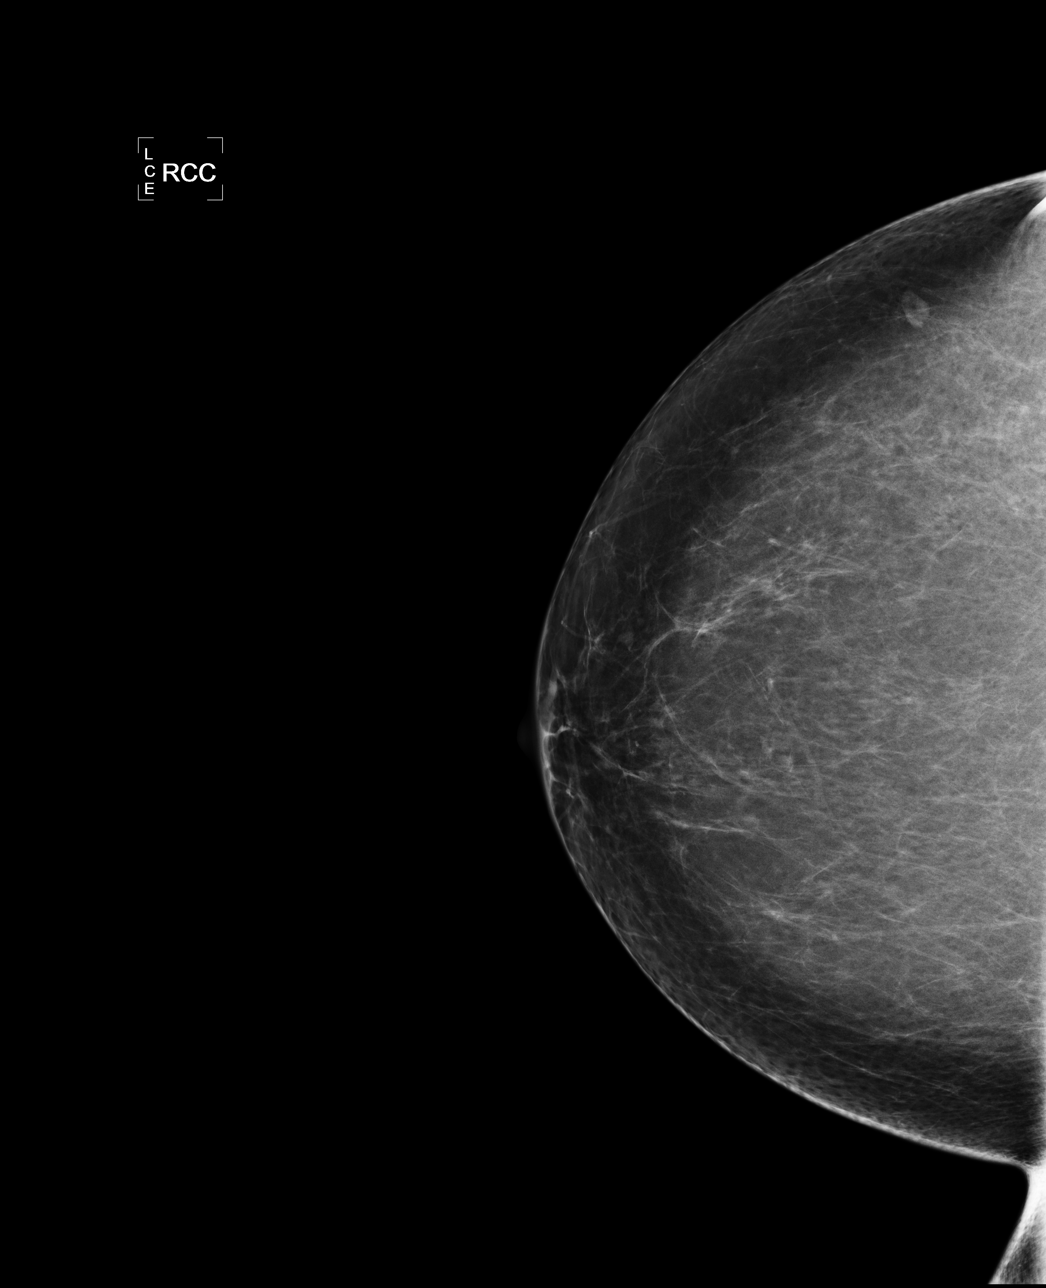

[L CC]
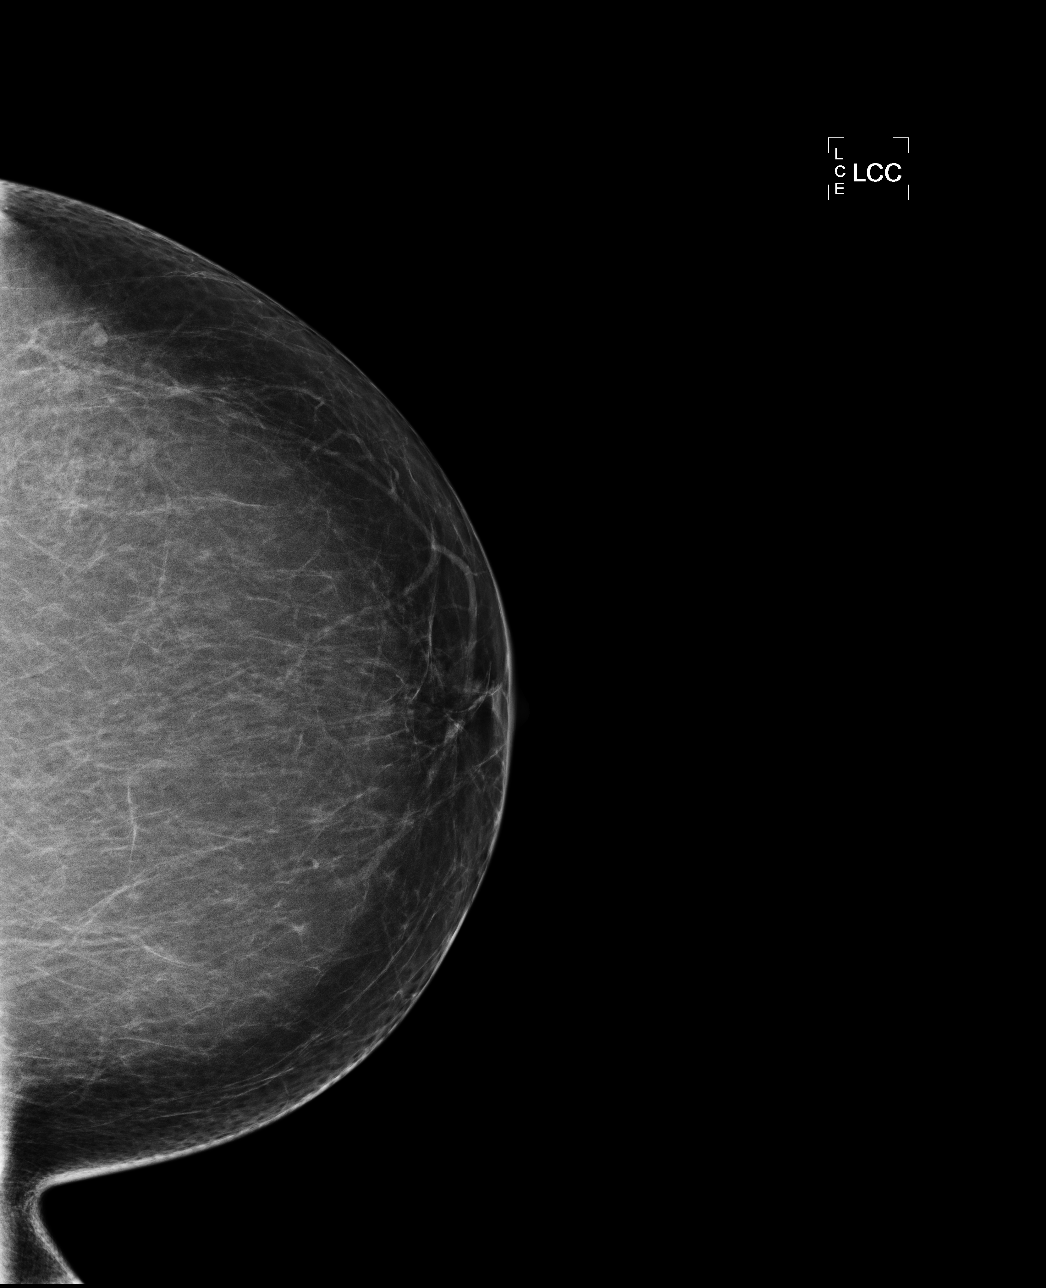

[L MLO]
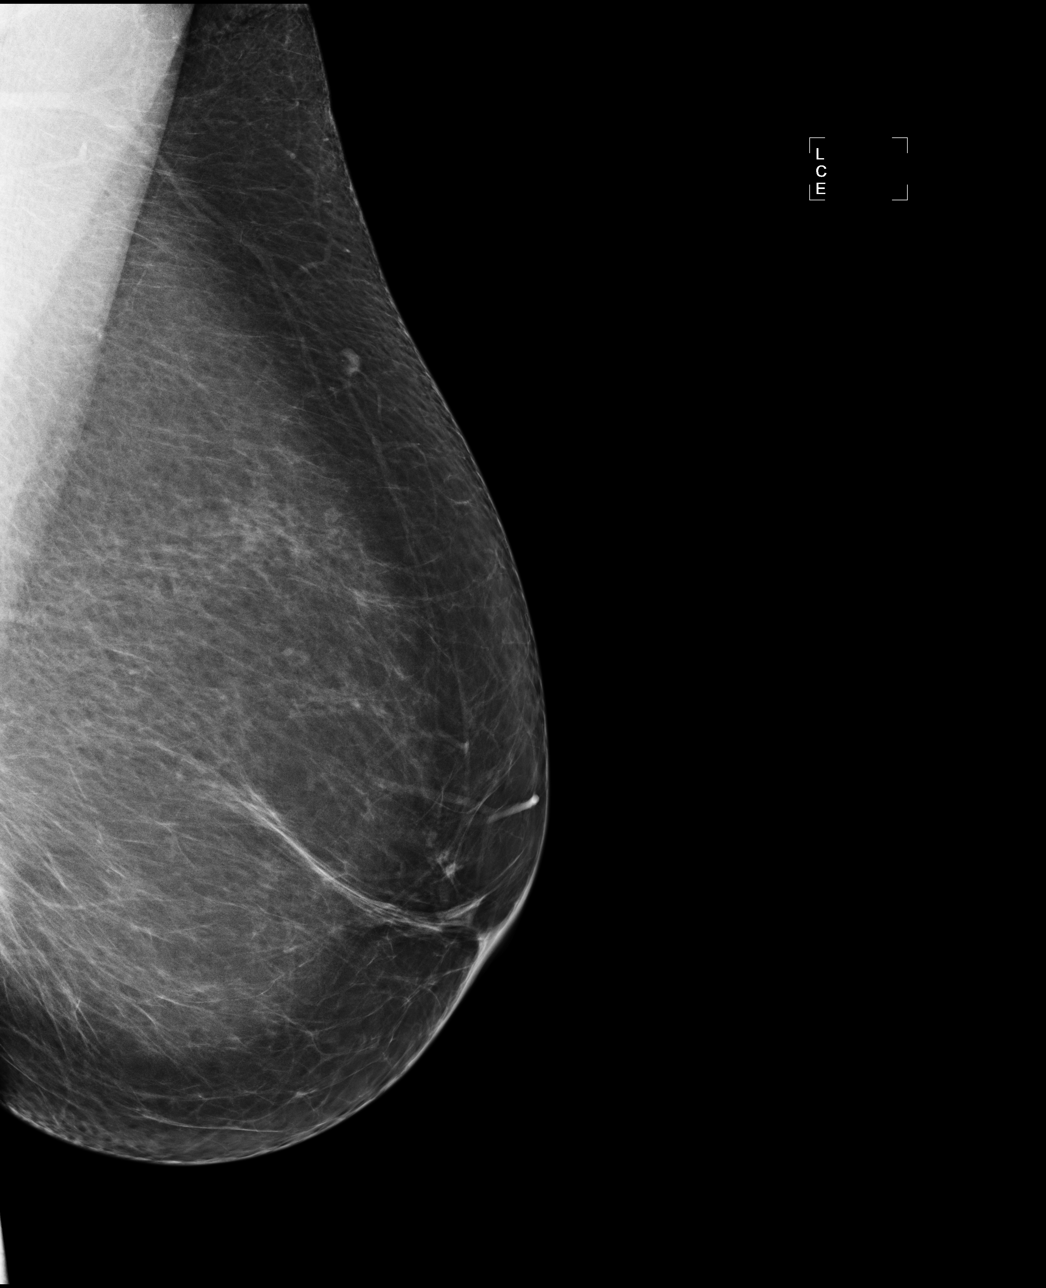

[R MLO]
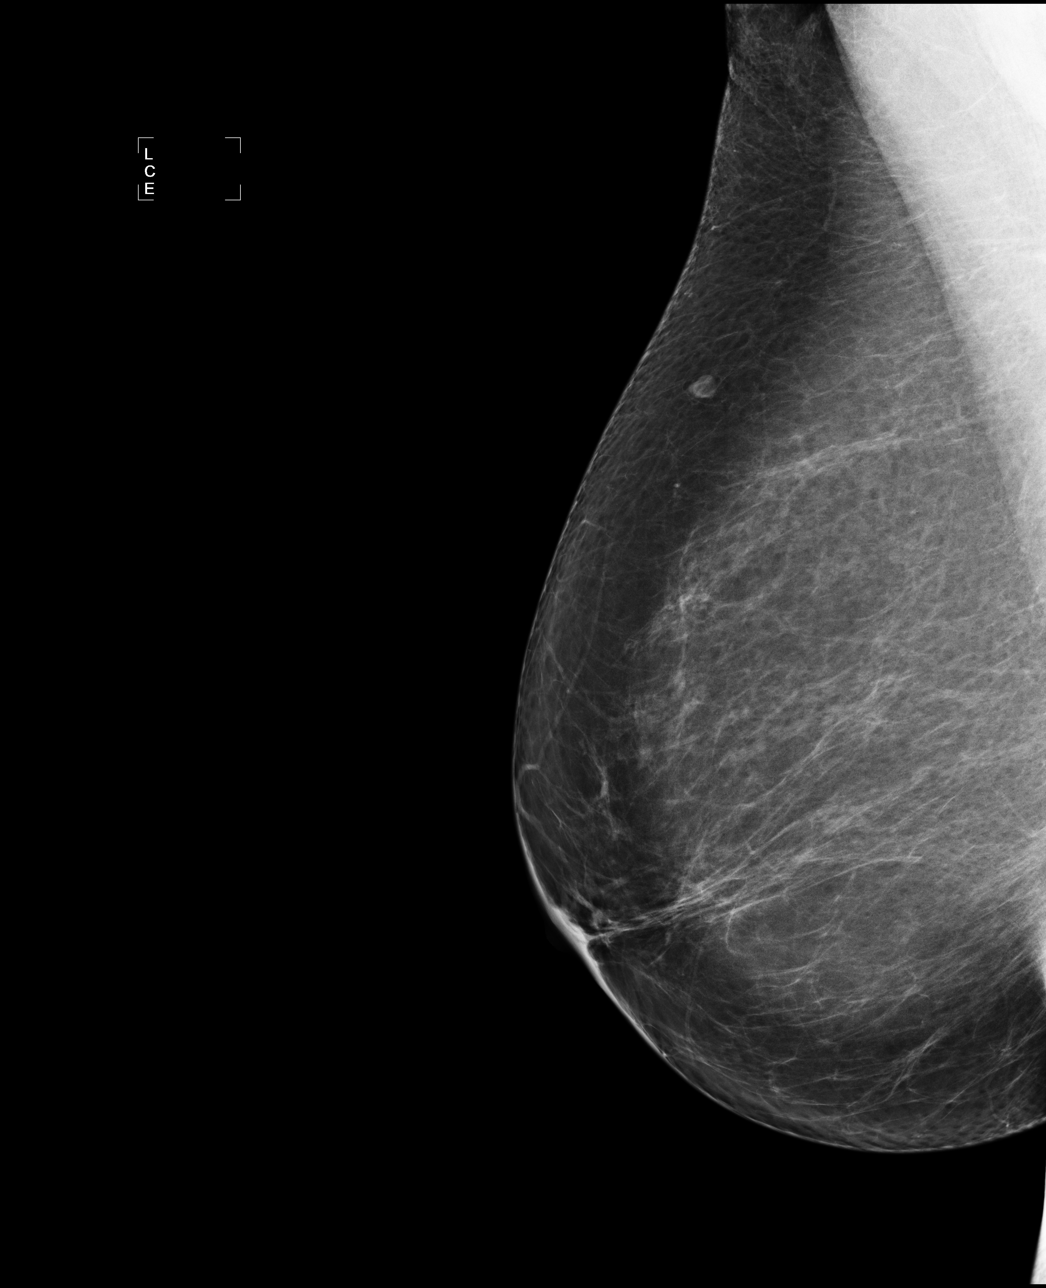

[4 of 4 positions shown; findings below may reference images not displayed]

ACR Breast Density Category b: There are scattered areas of
fibroglandular density.
FINDINGS: There are no findings suspicious for malignancy. Images were
processed with CAD.
IMPRESSION: No mammographic evidence of malignancy. A result letter of this
screening mammogram will be mailed directly to the patient.

RECOMMENDATION:
Screening mammogram in one year. (Code:AS-G-LCT)

BI-RADS CATEGORY  1: Negative.

## 2016-01-10 ENCOUNTER — Ambulatory Visit (INDEPENDENT_AMBULATORY_CARE_PROVIDER_SITE_OTHER): Payer: BLUE CROSS/BLUE SHIELD

## 2016-01-10 DIAGNOSIS — Z23 Encounter for immunization: Secondary | ICD-10-CM | POA: Diagnosis not present

## 2016-04-22 ENCOUNTER — Other Ambulatory Visit: Payer: Self-pay | Admitting: Family Medicine

## 2016-04-22 DIAGNOSIS — Z1231 Encounter for screening mammogram for malignant neoplasm of breast: Secondary | ICD-10-CM

## 2016-05-12 ENCOUNTER — Ambulatory Visit: Payer: BLUE CROSS/BLUE SHIELD

## 2016-06-23 ENCOUNTER — Ambulatory Visit
Admission: RE | Admit: 2016-06-23 | Discharge: 2016-06-23 | Disposition: A | Discharge status: Home / Self Care | Payer: BLUE CROSS/BLUE SHIELD | Source: Ambulatory Visit | Attending: Family Medicine | Admitting: Family Medicine

## 2016-06-23 DIAGNOSIS — Z1231 Encounter for screening mammogram for malignant neoplasm of breast: Secondary | ICD-10-CM

## 2016-07-14 ENCOUNTER — Other Ambulatory Visit: Payer: Self-pay | Admitting: Family Medicine

## 2016-10-30 ENCOUNTER — Other Ambulatory Visit: Payer: Self-pay | Admitting: Family Medicine

## 2016-10-30 DIAGNOSIS — Z01419 Encounter for gynecological examination (general) (routine) without abnormal findings: Secondary | ICD-10-CM

## 2016-11-03 ENCOUNTER — Other Ambulatory Visit: Payer: Self-pay | Admitting: Family Medicine

## 2016-11-09 ENCOUNTER — Other Ambulatory Visit (INDEPENDENT_AMBULATORY_CARE_PROVIDER_SITE_OTHER): Payer: BLUE CROSS/BLUE SHIELD

## 2016-11-09 DIAGNOSIS — Z01419 Encounter for gynecological examination (general) (routine) without abnormal findings: Secondary | ICD-10-CM | POA: Diagnosis not present

## 2016-11-09 LAB — CBC WITH DIFFERENTIAL/PLATELET
Basophils Absolute: 0 10*3/uL (ref 0.0–0.1)
Basophils Relative: 0.7 % (ref 0.0–3.0)
Eosinophils Absolute: 0.1 10*3/uL (ref 0.0–0.7)
Eosinophils Relative: 2 % (ref 0.0–5.0)
HCT: 41.7 % (ref 36.0–46.0)
Hemoglobin: 13.6 g/dL (ref 12.0–15.0)
Lymphocytes Relative: 42 % (ref 12.0–46.0)
Lymphs Abs: 2.2 10*3/uL (ref 0.7–4.0)
MCHC: 32.5 g/dL (ref 30.0–36.0)
MCV: 90.8 fl (ref 78.0–100.0)
Monocytes Absolute: 0.4 10*3/uL (ref 0.1–1.0)
Monocytes Relative: 8.5 % (ref 3.0–12.0)
Neutro Abs: 2.4 10*3/uL (ref 1.4–7.7)
Neutrophils Relative %: 46.8 % (ref 43.0–77.0)
Platelets: 204 10*3/uL (ref 150.0–400.0)
RBC: 4.59 Mil/uL (ref 3.87–5.11)
RDW: 13.3 % (ref 11.5–15.5)
WBC: 5.1 10*3/uL (ref 4.0–10.5)

## 2016-11-09 LAB — COMPREHENSIVE METABOLIC PANEL
ALT: 13 U/L (ref 0–35)
AST: 18 U/L (ref 0–37)
Albumin: 4.4 g/dL (ref 3.5–5.2)
Alkaline Phosphatase: 75 U/L (ref 39–117)
BUN: 11 mg/dL (ref 6–23)
CO2: 27 mEq/L (ref 19–32)
Calcium: 9.6 mg/dL (ref 8.4–10.5)
Chloride: 105 mEq/L (ref 96–112)
Creatinine, Ser: 0.79 mg/dL (ref 0.40–1.20)
GFR: 80.71 mL/min (ref >60.00)
Glucose, Bld: 97 mg/dL (ref 70–99)
Potassium: 4.6 mEq/L (ref 3.5–5.1)
Sodium: 141 mEq/L (ref 135–145)
Total Bilirubin: 0.4 mg/dL (ref 0.2–1.2)
Total Protein: 7 g/dL (ref 6.0–8.3)

## 2016-11-09 LAB — LIPID PANEL
Cholesterol: 164 mg/dL (ref 0–200)
HDL: 44.5 mg/dL (ref >39.00)
LDL Cholesterol: 97 mg/dL (ref 0–99)
NonHDL: 119.69
Total CHOL/HDL Ratio: 4
Triglycerides: 112 mg/dL (ref 0.0–149.0)
VLDL: 22.4 mg/dL (ref 0.0–40.0)

## 2016-11-09 LAB — TSH: TSH: 2.38 u[IU]/mL (ref 0.35–4.50)

## 2016-11-16 ENCOUNTER — Ambulatory Visit (INDEPENDENT_AMBULATORY_CARE_PROVIDER_SITE_OTHER): Payer: BLUE CROSS/BLUE SHIELD | Admitting: Family Medicine

## 2016-11-16 ENCOUNTER — Encounter: Payer: Self-pay | Admitting: Family Medicine

## 2016-11-16 VITALS — BP 106/64 | HR 82 | Temp 98.1°F | Ht 64.0 in | Wt 179.0 lb

## 2016-11-16 DIAGNOSIS — Z0000 Encounter for general adult medical examination without abnormal findings: Secondary | ICD-10-CM | POA: Diagnosis not present

## 2016-11-16 DIAGNOSIS — E785 Hyperlipidemia, unspecified: Secondary | ICD-10-CM | POA: Diagnosis not present

## 2016-11-16 DIAGNOSIS — Z23 Encounter for immunization: Secondary | ICD-10-CM

## 2016-11-16 DIAGNOSIS — H8102 Meniere's disease, left ear: Secondary | ICD-10-CM

## 2016-11-16 NOTE — Patient Instructions (Signed)
Great to see you.  Your labs look great! 

## 2016-11-16 NOTE — Assessment & Plan Note (Signed)
Well controlled on low dose lipitor. No changes made. 

## 2016-11-16 NOTE — Assessment & Plan Note (Signed)
Reviewed preventive care protocols, scheduled due services, and updated immunizations Discussed nutrition, exercise, diet, and healthy lifestyle.  

## 2016-11-16 NOTE — Progress Notes (Addendum)
Subjective:   Patient ID: Tonya Anderson, female    DOB: Sep 23, 1965, 51 y.o.   MRN: [id number]  Tonya Anderson is a pleasant 51 y.o. year old female who presents to clinic today with Annual Exam  on 11/16/2016  HPI:  LMP 7+ years ago- no h/o post menopausal bleeding. No h/o abnormal pap smears.  Last pap 09/30/15 Mammogram 06/27/16   No family history of breast or uterine cancer. Colonoscopy 01/21/15  Menire's disease- takes a diuretic twice weekly to help with fluid build up behind her left ear and dizzy spells.  Denies any recent dizzy spells or syncope.  Physically active- [facility] at [facility] [employer]- bikes, swims, walks.  HLD- well controlled on low dose lipitor.   Lab Results  Component Value Date   CHOL 164 11/09/2016   HDL 44.50 11/09/2016   LDLCALC 97 11/09/2016   TRIG 112.0 11/09/2016   CHOLHDL 4 11/09/2016   Lab Results  Component Value Date   ALT 13 11/09/2016   AST 18 11/09/2016   ALKPHOS 75 11/09/2016   BILITOT 0.4 11/09/2016   Lab Results  Component Value Date   NA 141 11/09/2016   K 4.6 11/09/2016   CL 105 11/09/2016   CO2 27 11/09/2016   Lab Results  Component Value Date   TSH 2.38 11/09/2016   Lab Results  Component Value Date   CREATININE 0.79 11/09/2016   Lab Results  Component Value Date   NA 141 11/09/2016   K 4.6 11/09/2016   CL 105 11/09/2016   CO2 27 11/09/2016    Current Outpatient Prescriptions on File Prior to Visit  Medication Sig Dispense Refill  . atorvastatin (LIPITOR) 10 MG tablet TAKE 1 TABLET BY MOUTH DAILY 30 tablet 0  . triamterene-hydrochlorothiazide (MAXZIDE-25) 37.5-25 MG tablet Take 1 tablet by mouth 2 (two) times a week. Reported on 09/18/2015 90 tablet 2   No current facility-administered medications on file prior to visit.     Allergies  Allergen Reactions  . Ancef [Cefazolin] Anaphylaxis    Past Medical History:  Diagnosis Date  . Arthritis   . Blood in stool   . Constipation   . Hemorrhoids    . History of fainting spells of unknown cause   . Hyperlipidemia   . Meniere disease     Past Surgical History:  Procedure Laterality Date  . BUNIONECTOMY    . CERVICAL CERCLAGE  1994 and 1995  . OVARY SURGERY  1986   removed cyst and ovary    Family History  Problem Relation Age of Onset  . Arthritis Mother   . Hyperlipidemia Father   . Depression Father   . Arthritis Maternal Grandmother   . Heart disease Paternal Grandfather   . Colon cancer Neg Hx     Social History   Social History  . Marital status: Married    Spouse name: N/A  . Number of children: N/A  . Years of education: N/A   Occupational History  . Not on file.   Social History Main Topics  . Smoking status: Never Smoker  . Smokeless tobacco: Never Used  . Alcohol use 0.0 oz/week     Comment:  4-6/week beer/wine/vodka  . Drug use: No  . Sexual activity: Yes     Comment: husband has vasectomy   Other Topics Concern  . 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    Review of Systems  Constitutional: Negative.   HENT: Negative.   Eyes: Negative.   Respiratory: Negative.   Cardiovascular: Negative.   Gastrointestinal: Negative.   Endocrine: Negative.   Genitourinary: Negative.   Musculoskeletal: Negative.   Skin: Negative.   Allergic/Immunologic: Negative.   Neurological: Negative.   Hematological: Negative.   Psychiatric/Behavioral: Negative.   All other systems reviewed and are negative.      Objective:    BP 106/64   Pulse 82   Temp 98.1 F (36.7 C) (Oral)   Ht  (1.626 m)   Wt 179 lb (81.2 kg)   BMI 30.73 kg/m    Physical Exam   General:  Well-developed,well-nourished,in no acute distress; alert,appropriate and cooperative throughout examination Head:  normocephalic and  atraumatic.   Eyes:  vision grossly intact, PERRL Ears:  R ear normal and L ear normal externally, TMs clear bilaterally Nose:  no external deformity.   Mouth:  good dentition.   Neck:  No deformities, masses, or tenderness noted. Breasts:  No mass, nodules, thickening, tenderness, bulging, retraction, inflamation, nipple discharge or skin changes noted.   Lungs:  Normal respiratory effort, chest expands symmetrically. Lungs are clear to auscultation, no crackles or wheezes. Heart:  Normal rate and regular rhythm. S1 and S2 normal without gallop, murmur, click, rub or other extra sounds. Abdomen:  Bowel sounds positive,abdomen soft and non-tender without masses, organomegaly or hernias noted. Msk:  No deformity or scoliosis noted of thoracic or lumbar spine.   Extremities:  No clubbing, cyanosis, edema, or deformity noted with normal full range of motion of all joints.   Neurologic:  alert & oriented X3 and gait normal.   Skin:  Intact without suspicious lesions or rashes Cervical Nodes:  No lymphadenopathy noted Axillary Nodes:  No palpable lymphadenopathy Psych:  Cognition and judgment appear intact. Alert and cooperative with normal attention span and concentration. No apparent delusions, illusions, hallucinations      Assessment & Plan:   Well woman exam with routine gynecological exam  Meniere's disease of left ear  Hyperlipidemia, unspecified hyperlipidemia type No Follow-up on file.

## 2016-11-23 ENCOUNTER — Other Ambulatory Visit: Payer: Self-pay | Admitting: Family Medicine

## 2016-12-04 ENCOUNTER — Other Ambulatory Visit: Payer: Self-pay | Admitting: Family Medicine

## 2017-02-11 ENCOUNTER — Telehealth: Payer: Self-pay

## 2017-02-11 NOTE — Telephone Encounter (Signed)
TA-Pt called asking for Cipro for travel/I LMOVM for her to RTC to advise when she is leaving, where she is going to, and when will she be returning/Idk if she talked to you about this before? Just asking/thx dmf

## 2017-02-11 NOTE — Telephone Encounter (Signed)
Pt does not have new pt appt with Pamala Hurry Baity NP until 03/22/17; last saw Dr Dayton MartesAron for annual on 11/16/16.

## 2017-02-11 NOTE — Telephone Encounter (Signed)
Copied from CRM #[id number]. Topic: General - Other >> Feb 11, 2017 10:05 AM Percival SpanishKennedy, Cheryl W wrote:  Pt is previous pt of Dr Dayton MartesAron and will be traveling and is asking for CIPRO  this will be a new RX    She said they are requesting that they take this med witrh them  Pharm[facility] [place]

## 2017-02-12 ENCOUNTER — Telehealth: Payer: Self-pay | Admitting: Family Medicine

## 2017-02-12 NOTE — Telephone Encounter (Signed)
It very much depends on where in [place] she is traveling and what her plans are while she is there.  This information is also on the website.  Please get more information from her and we can figure out what she needs.  The health department is also a great way to get travel vaccines and medications.

## 2017-02-12 NOTE — Telephone Encounter (Signed)
Copied from CRM [phone]. Topic: Quick Communication - Office Called Patient >> Feb 12, 2017 11:19 AM Louie BunPalacios Medina, Rosey Batheresa D wrote: Reason for CRM: Patient called back returning patients office phone call. She said that she is traveling to [place] and dates are 12/26-01/06/19.

## 2017-02-12 NOTE — Telephone Encounter (Unsigned)
Copied from CRM [phone]. Topic: Quick Communication - Office Called Patient >> Feb 12, 2017 11:19 AM Louie BunPalacios Medina, Rosey Batheresa D wrote: Reason for CRM: Patient called back returning patients office phone call. She said that she is traveling to [place] and dates are 12/26-01/06/19.  >> Feb 12, 2017  5:21 PM Raquel SarnaHayes, Teresa G wrote: Locations Pt will be going in [place]:  [place]  Guide recommended Rx - Zipro

## 2017-02-12 NOTE — Telephone Encounter (Signed)
TA-Pt called back to inform of information that I requested about her travels and request for Cipro/but according to [facility] website pt should take something like Malarone and start 1-2d prior to travel and throughout until 7 days after returns from [place]/not sure but there are a few others as well/ Here is the website   EgoNews.glHttps://www.cdc.gov/malaria/travelers/drugs.html  Plz advise/thx dmf

## 2017-02-12 NOTE — Telephone Encounter (Signed)
LMOVM for pt to RTC to advise where in [place] she will be going so that TA can advise what to do/thx dmf

## 2017-02-15 NOTE — Telephone Encounter (Signed)
Added this information to original note and sent to TA for review/thx dmf

## 2017-02-15 NOTE — Telephone Encounter (Signed)
TA-Patient states that she [place]  Plz advise/thx dmf

## 2017-02-16 NOTE — Telephone Encounter (Signed)
CN-I researched the CDC and all 3 sites she will be visiting are recommending anti-malaria treatment/I also recorded all of the vaccines that she has received per the [facility] Website/has all recommended vaccines for travel to that area  I left her a message asking her who advised Cipro as this is not something that is listed on the CDC website as something that should be given/Plz advise/thx dmf

## 2017-02-16 NOTE — Telephone Encounter (Signed)
Per CDC guidelines, cipro is not recommended.

## 2017-02-16 NOTE — Telephone Encounter (Signed)
Per CDC guidelines, Cipro is not recommended.

## 2017-02-17 NOTE — Telephone Encounter (Signed)
Yes I read the mychart message. I already gave my response to this

## 2017-02-17 NOTE — Telephone Encounter (Signed)
Oral antibiotic is not recommended for traveler's diarrhea. Please have patient review to [facility]. Thank you

## 2017-02-17 NOTE — Telephone Encounter (Signed)
Sent MyChart message advising of providers recommendations/thx dmf

## 2017-02-17 NOTE — Telephone Encounter (Signed)
CN-Were you able to look at the MyChart message about this convo? Patient is basically wanting the Abx for travelers diarrhea and has had problems when she has travelled to other places in the past with that/If Cipro is not a good choice is there something that you would advise? thx dmf

## 2017-02-17 NOTE — Telephone Encounter (Signed)
Sent pt a MyChart message giving providers recommendations/thx dmf

## 2017-03-08 ENCOUNTER — Encounter: Payer: Self-pay | Admitting: Internal Medicine

## 2017-03-08 ENCOUNTER — Ambulatory Visit (INDEPENDENT_AMBULATORY_CARE_PROVIDER_SITE_OTHER): Payer: BLUE CROSS/BLUE SHIELD | Admitting: Internal Medicine

## 2017-03-08 VITALS — BP 120/80 | HR 76 | Temp 98.4°F | Wt 178.0 lb

## 2017-03-08 DIAGNOSIS — J209 Acute bronchitis, unspecified: Secondary | ICD-10-CM | POA: Diagnosis not present

## 2017-03-08 MED ORDER — ALBUTEROL SULFATE HFA 108 (90 BASE) MCG/ACT IN AERS: 2.0000 | INHALATION_SPRAY | Freq: Four times a day (QID) | RESPIRATORY_TRACT | 0 refills | Status: DC | PRN

## 2017-03-08 MED ORDER — AZITHROMYCIN 250 MG PO TABS: ORAL_TABLET | ORAL | 0 refills | Status: DC

## 2017-03-08 NOTE — Progress Notes (Signed)
HPI  Pt presents to the clinic today with c/o nasal congestion, cough and chest congestion. This started 5 days ago. She is blowing yellow mucous out of her nose. The cough is productive of green/brown mucous. She denies fever, chills or body aches, but hasn't been feeling well. She has tried Tylenol Cold and Sinus with minimal relief. She has had sick contacts and just got back from [place].  Review of Systems      Past Medical History:  Diagnosis Date  . Arthritis   . Blood in stool   . Constipation   . Hemorrhoids   . History of fainting spells of unknown cause   . Hyperlipidemia   . Meniere disease     Family History  Problem Relation Age of Onset  . Arthritis Mother   . Hyperlipidemia Father   . Depression Father   . Arthritis Maternal Grandmother   . Heart disease Paternal Grandfather   . Colon cancer Neg Hx     Social History   Socioeconomic History  . Marital status: Married    Spouse name: Not on file  . Number of children: Not on file  . Years of education: Not on file  . Highest education level: Not on file  Social Needs  . Financial resource strain: Not on file  . Food insecurity - worry: Not on file  . Food insecurity - inability: Not on file  . Transportation needs - medical: Not on file  . Transportation needs - non-medical: Not on file  Occupational History  . Not on file  Tobacco Use  . Smoking status: Never Smoker  . Smokeless tobacco: Never Used  Substance and Sexual Activity  . Alcohol use: Yes    Alcohol/week: 0.0 oz    Comment:  4-6/week beer/wine/vodka  . Drug use: No  . Sexual activity: Yes    Comment: husband has vasectomy  Other Topics Concern  . 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 Ancef [Cefazolin] Anaphylaxis     Constitutional: Denies headache, fatigue, fever or  abrupt weight changes.  HEENT:  Positive nasal congestion. Denies eye  redness, eye pain, pressure behind the eyes, facial pain, ear pain, ringing in the ears, wax buildup or sore throat. Respiratory: Positive cough. Denies difficulty breathing or shortness of breath.  Cardiovascular: Denies chest pain, chest tightness, palpitations or swelling in the hands or feet.   No other specific complaints in a complete review of systems (except as listed in HPI above).  Objective:   BP 120/80   Pulse 76   Temp 98.4 F (36.9 C) (Oral)   Wt 178 lb (80.7 kg)   SpO2 96%   BMI 30.55 kg/m  Wt Readings from Last 3 Encounters:  03/08/17 178 lb (80.7 kg)  11/16/16 179 lb (81.2 kg)  09/30/15 180 lb (81.6 kg)     General: Appears her stated age, in NAD. HEENT: Head: normal shape and size, no sinus tenderness ntoed;  Ears: Tm's gray and intact, normal light reflex; Nose: mucosa pink and moist, septum midline; Throat/Mouth: Teeth present, mucosa erythematous and moist, no exudate noted, no lesions or ulcerations noted.  Neck: No cervical lymphadenopathy.  Cardiovascular: Normal rate and rhythm. S1,S2 noted.  No murmur, rubs or gallops noted.  Pulmonary/Chest: Normal effort and scattered ronchi throughout. No respiratory distress. No wheezes, rales or noted.       Assessment & Plan:  Acute Bronchitis:  Get some rest and drink plenty of water eRx for Azithromax x 5 days eRx for Albuterol inhaler She declines RX for cough syrup   RTC as needed or if symptoms persist.   Nicki ReaperBAITY, Alexsis Branscom, NP

## 2017-03-08 NOTE — Patient Instructions (Signed)

## 2017-03-22 ENCOUNTER — Encounter: Payer: Self-pay | Admitting: Internal Medicine

## 2017-03-22 ENCOUNTER — Ambulatory Visit (INDEPENDENT_AMBULATORY_CARE_PROVIDER_SITE_OTHER): Payer: BLUE CROSS/BLUE SHIELD | Admitting: Internal Medicine

## 2017-03-22 DIAGNOSIS — E7800 Pure hypercholesterolemia, unspecified: Secondary | ICD-10-CM

## 2017-03-22 DIAGNOSIS — H8102 Meniere's disease, left ear: Secondary | ICD-10-CM | POA: Diagnosis not present

## 2017-03-22 DIAGNOSIS — Z9189 Other specified personal risk factors, not elsewhere classified: Secondary | ICD-10-CM | POA: Diagnosis not present

## 2017-03-22 NOTE — Assessment & Plan Note (Signed)
None recently Will monitor 

## 2017-03-22 NOTE — Assessment & Plan Note (Signed)
Encouraged her to consume a low fat diet Continue Atorvastatin for now 

## 2017-03-22 NOTE — Progress Notes (Signed)
HPI  Pt presents to the clinic today to establish care and for management of the conditions listed below. She is transferring care from Dr. Dayton MartesAron.  Meniere's Disease: Controlled with Triamterene HCTZ 2 x week. Her BP today is 102/68.  HLD: Her last LDL was 97, 10/2016. She denies myalgias on Atorvastatin. She tries to consume a low fat diet.  History of Fainting Spells: This occurs sporadically. This is triggered by alcohol, sugar, heat, physical exertion. She has had an extensive workup of this 15 years ago.  Flu: 10/2016 Tetanus: 04/2015 Pap Smear: 08/2015 Mammogram: 05/2016 Colon Screening: 01/2015 Vision Screening: as needed Dentist: biannually   Past Medical History:  Diagnosis Date  . Arthritis   . Blood in stool   . Constipation   . Hemorrhoids   . History of fainting spells of unknown cause   . Hyperlipidemia   . Meniere disease     Current Outpatient Medications  Medication Sig Dispense Refill  . albuterol (PROVENTIL HFA;VENTOLIN HFA) 108 (90 Base) MCG/ACT inhaler Inhale 2 puffs into the lungs every 6 (six) hours as needed for wheezing or shortness of breath. 1 Inhaler 0  . atorvastatin (LIPITOR) 10 MG tablet TAKE 1 TABLET BY MOUTH DAILY *MUST KEEP APPOINTMENT* 30 tablet 11  . azithromycin (ZITHROMAX) 250 MG tablet Take 2 tabs today, then 1 tab daily x 4 days 6 tablet 0  . triamterene-hydrochlorothiazide (MAXZIDE-25) 37.5-25 MG tablet TAKE 1 TABLET BY MOUTH 2 TIMES PER WEEK 90 tablet 3   No current facility-administered medications for this visit.     Allergies  Allergen Reactions  . Ancef [Cefazolin] Anaphylaxis    Family History  Problem Relation Age of Onset  . Arthritis Mother   . Hyperlipidemia Father   . Depression Father   . Arthritis Maternal Grandmother   . Heart disease Paternal Grandfather   . Colon cancer Neg Hx     Social History   Socioeconomic History  . Marital status: Married    Spouse name: Not on file  . Number of children: Not on file   . Years of education: Not on file  . Highest education level: Not on file  Social Needs  . Financial resource strain: Not on file  . Food insecurity - worry: Not on file  . Food insecurity - inability: Not on file  . Transportation needs - medical: Not on file  . Transportation needs - non-medical: Not on file  Occupational History  . Not on file  Tobacco Use  . Smoking status: Never Smoker  . Smokeless tobacco: Never Used  Substance and Sexual Activity  . Alcohol use: Yes    Alcohol/week: 0.0 oz    Comment:  4-6/week beer/wine/vodka  . Drug use: No  . Sexual activity: Yes    Comment: husband has vasectomy  Other Topics Concern  . 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  HEENT: Denies eye pain, eye redness, ear pain, ringing in the ears, wax buildup, runny nose, nasal congestion, bloody nose, or sore throat. Respiratory: Denies difficulty breathing, shortness of breath, cough or sputum production.   Cardiovascular: Denies chest pain, chest tightness, palpitations or swelling in the hands or feet.  Gastrointestinal: Denies abdominal pain, bloating, constipation, diarrhea or blood in the stool.  GU: Denies frequency, urgency, pain with urination, blood in urine, odor or discharge. Musculoskeletal: Denies decrease in range  of motion, difficulty with gait, muscle pain or joint pain and swelling.  Skin: Denies redness, rashes, lesions or ulcercations.  Neurological: Denies dizziness, difficulty with memory, difficulty with speech or problems with balance and coordination.  Psych: Denies anxiety, depression, SI/HI.  No other specific complaints in a complete review of systems (except as listed in HPI above).  PE:  BP 102/68 (BP Location: Left Arm, Patient Position: Sitting, Cuff Size: Normal)   Pulse 70   Temp 97.9 F (36.6 C) (Oral)    Resp 18   Ht 5\' 4"  (1.626 m)   Wt 179 lb 6.4 oz (81.4 kg)   SpO2 97%   BMI 30.79 kg/m   Wt Readings from Last 3 Encounters:  03/08/17 178 lb (80.7 kg)  11/16/16 179 lb (81.2 kg)  09/30/15 180 lb (81.6 kg)    General: Appears her stated age, well developed, well nourished in NAD. Cardiovascular: Normal rate and rhythm. S1,S2 noted.  No murmur, rubs or gallops noted.  Pulmonary/Chest: Normal effort and positive vesicular breath sounds. No respiratory distress. No wheezes, rales or ronchi noted.  Neurological: Alert and oriented. Cranial nerves II-XII grossly intact. Coordination normal.  Psychiatric: Mood and affect normal. Behavior is normal. Judgment and thought content normal.    BMET    Component Value Date/Time   NA 141 11/09/2016 0944   K 4.6 11/09/2016 0944   CL 105 11/09/2016 0944   CO2 27 11/09/2016 0944   GLUCOSE 97 11/09/2016 0944   BUN 11 11/09/2016 0944   CREATININE 0.79 11/09/2016 0944   CALCIUM 9.6 11/09/2016 0944    Lipid Panel     Component Value Date/Time   CHOL 164 11/09/2016 0944   TRIG 112.0 11/09/2016 0944   HDL 44.50 11/09/2016 0944   CHOLHDL 4 11/09/2016 0944   VLDL 22.4 11/09/2016 0944   LDLCALC 97 11/09/2016 0944    CBC    Component Value Date/Time   WBC 5.1 11/09/2016 0944   RBC 4.59 11/09/2016 0944   HGB 13.6 11/09/2016 0944   HCT 41.7 11/09/2016 0944   PLT 204.0 11/09/2016 0944   MCV 90.8 11/09/2016 0944   MCHC 32.5 11/09/2016 0944   RDW 13.3 11/09/2016 0944   LYMPHSABS 2.2 11/09/2016 0944   MONOABS 0.4 11/09/2016 0944   EOSABS 0.1 11/09/2016 0944   BASOSABS 0.0 11/09/2016 0944    Hgb A1C No results found for: HGBA1C   Assessment and Plan:

## 2017-03-22 NOTE — Patient Instructions (Signed)
Fat and Cholesterol Restricted Diet Getting too much fat and cholesterol in your diet may cause health problems. Following this diet helps keep your fat and cholesterol at normal levels. This can keep you from getting sick. What types of fat should I choose?  Choose monosaturated and polyunsaturated fats. These are found in foods such as olive oil, canola oil, flaxseeds, walnuts, almonds, and seeds.  Eat more omega-3 fats. Good choices include salmon, mackerel, sardines, tuna, flaxseed oil, and ground flaxseeds.  Limit saturated fats. These are in animal products such as meats, butter, and cream. They can also be in plant products such as palm oil, palm kernel oil, and coconut oil.  Avoid foods with partially hydrogenated oils in them. These contain trans fats. Examples of foods that have trans fats are stick margarine, some tub margarines, cookies, crackers, and other baked goods. What general guidelines do I need to follow?  Check food labels. Look for the words "trans fat" and "saturated fat."  When preparing a meal: ? Fill half of your plate with vegetables and green salads. ? Fill one fourth of your plate with whole grains. Look for the word "whole" as the first word in the ingredient list. ? Fill one fourth of your plate with lean protein foods.  Eat more foods that have fiber, like apples, carrots, beans, peas, and barley.  Eat more home-cooked foods. Eat less at restaurants and buffets.  Limit or avoid alcohol.  Limit foods high in starch and sugar.  Limit fried foods.  Cook foods without frying them. Baking, boiling, grilling, and broiling are all great options.  Lose weight if you are overweight. Losing even a small amount of weight can help your overall health. It can also help prevent diseases such as diabetes and heart disease. What foods can I eat? Grains Whole grains, such as whole wheat or whole grain breads, crackers, cereals, and pasta. Unsweetened oatmeal,  bulgur, barley, quinoa, or brown rice. Corn or whole wheat flour tortillas. Vegetables Fresh or frozen vegetables (raw, steamed, roasted, or grilled). Green salads. Fruits All fresh, canned (in natural juice), or frozen fruits. Meat and Other Protein Products Ground beef (85% or leaner), grass-fed beef, or beef trimmed of fat. Skinless chicken or turkey. Ground chicken or turkey. Pork trimmed of fat. All fish and seafood. Eggs. Dried beans, peas, or lentils. Unsalted nuts or seeds. Unsalted canned or dry beans. Dairy Low-fat dairy products, such as skim or 1% milk, 2% or reduced-fat cheeses, low-fat ricotta or cottage cheese, or plain low-fat yogurt. Fats and Oils Tub margarines without trans fats. Light or reduced-fat mayonnaise and salad dressings. Avocado. Olive, canola, sesame, or safflower oils. Natural peanut or almond butter (choose ones without added sugar and oil). The items listed above may not be a complete list of recommended foods or beverages. Contact your dietitian for more options. What foods are not recommended? Grains White bread. White pasta. White rice. Cornbread. Bagels, pastries, and croissants. Crackers that contain trans fat. Vegetables White potatoes. Corn. Creamed or fried vegetables. Vegetables in a cheese sauce. Fruits Dried fruits. Canned fruit in light or heavy syrup. Fruit juice. Meat and Other Protein Products Fatty cuts of meat. Ribs, chicken wings, bacon, sausage, bologna, salami, chitterlings, fatback, hot dogs, bratwurst, and packaged luncheon meats. Liver and organ meats. Dairy Whole or 2% milk, cream, half-and-half, and cream cheese. Whole milk cheeses. Whole-fat or sweetened yogurt. Full-fat cheeses. Nondairy creamers and whipped toppings. Processed cheese, cheese spreads, or cheese curds. Sweets and Desserts Corn   syrup, sugars, honey, and molasses. Candy. Jam and jelly. Syrup. Sweetened cereals. Cookies, pies, cakes, donuts, muffins, and ice  cream. Fats and Oils Butter, stick margarine, lard, shortening, ghee, or bacon fat. Coconut, palm kernel, or palm oils. Beverages Alcohol. Sweetened drinks (such as sodas, lemonade, and fruit drinks or punches). The items listed above may not be a complete list of foods and beverages to avoid. Contact your dietitian for more information. This information is not intended to replace advice given to you by your health care provider. Make sure you discuss any questions you have with your health care provider. Document Released: 08/18/2011 Document Revised: 10/24/2015 Document Reviewed: 05/18/2013 Elsevier Interactive Patient Education  2018 [employer]  

## 2017-03-22 NOTE — Assessment & Plan Note (Signed)
Controlled on Triamterene HCTZ Creatinine normal.

## 2017-07-01 ENCOUNTER — Encounter: Payer: Self-pay | Admitting: Family Medicine

## 2017-07-01 ENCOUNTER — Ambulatory Visit (INDEPENDENT_AMBULATORY_CARE_PROVIDER_SITE_OTHER): Payer: BLUE CROSS/BLUE SHIELD | Admitting: Family Medicine

## 2017-07-01 DIAGNOSIS — L0390 Cellulitis, unspecified: Secondary | ICD-10-CM

## 2017-07-01 MED ORDER — DOXYCYCLINE HYCLATE 100 MG PO TABS: 100.0000 mg | ORAL_TABLET | Freq: Two times a day (BID) | ORAL | 0 refills | Status: DC

## 2017-07-01 NOTE — Progress Notes (Signed)
Possible tick bite last week.  Felt something last week on her back.  She scratched and had a brown fleck that came off "and I thought it had a leg".  She has h/o "blackheads" on her back.  Now with rash locally.  No FCNAVD.  She doesn't feel unwell.  Itchy and sensitive locally.    Meds, vitals, and allergies reviewed.   ROS: Per HPI unless specifically indicated in ROS section   nad ncat Neck supple, no LA rrr ctab abd soft Ext w/o edema.  10x7cm rash in lower central back.  Pinkish, blanches, no ulceration.  No bull's-eye changes.  No other rash noted.  I do not see retained portion of the tick on exam with magnification.  She does have a small black speck <<<1cm but this looks like it could be part of previous scab.

## 2017-07-01 NOTE — Patient Instructions (Signed)
Start doxy, careful with sun exposure.  Update [place] as needed. The pinkish area should gradually get smaller. Take care.  Glad to see you.

## 2017-07-02 DIAGNOSIS — L0390 Cellulitis, unspecified: Secondary | ICD-10-CM | POA: Insufficient documentation

## 2017-07-02 NOTE — Assessment & Plan Note (Signed)
Looks to be a local cellulitis.  Uncertain cause.  She could have had a tick bite or some other arthropod bite/sting.  She could have had some other type of incidental epithelial disruption on her back.  All of those could have become secondarily infected and presented with symptoms like she currently has.  Reasonable to treat with doxycycline with routine cautions.  Even if she had a tick associated illness (which I do not think she has), doxycycline would still be a reasonable choice.  No fluctuant mass that needs incision and drainage.  I do not want to explore the area looking for retained portion of a tick without a good target.  Discussed with patient about rationale.  She understood.  Okay for outpatient follow-up.  Update [place] as needed.  Border of the area marked.

## 2017-10-04 ENCOUNTER — Other Ambulatory Visit: Payer: Self-pay | Admitting: Family Medicine

## 2017-10-04 ENCOUNTER — Other Ambulatory Visit: Payer: Self-pay | Admitting: Internal Medicine

## 2017-10-04 DIAGNOSIS — Z1231 Encounter for screening mammogram for malignant neoplasm of breast: Secondary | ICD-10-CM

## 2017-10-28 ENCOUNTER — Ambulatory Visit
Admission: RE | Admit: 2017-10-28 | Discharge: 2017-10-28 | Disposition: A | Discharge status: Home / Self Care | Payer: BLUE CROSS/BLUE SHIELD | Source: Ambulatory Visit | Attending: Internal Medicine | Admitting: Internal Medicine

## 2017-10-28 DIAGNOSIS — Z1231 Encounter for screening mammogram for malignant neoplasm of breast: Secondary | ICD-10-CM

## 2017-11-22 ENCOUNTER — Encounter: Payer: BLUE CROSS/BLUE SHIELD | Admitting: Internal Medicine

## 2017-11-24 ENCOUNTER — Encounter: Payer: Self-pay | Admitting: Internal Medicine

## 2017-11-24 ENCOUNTER — Ambulatory Visit (INDEPENDENT_AMBULATORY_CARE_PROVIDER_SITE_OTHER): Payer: BLUE CROSS/BLUE SHIELD | Admitting: Internal Medicine

## 2017-11-24 VITALS — BP 116/70 | HR 76 | Temp 97.9°F | Ht 63.5 in | Wt 184.0 lb

## 2017-11-24 DIAGNOSIS — Z0000 Encounter for general adult medical examination without abnormal findings: Secondary | ICD-10-CM

## 2017-11-24 DIAGNOSIS — Z713 Dietary counseling and surveillance: Secondary | ICD-10-CM

## 2017-11-24 DIAGNOSIS — H8102 Meniere's disease, left ear: Secondary | ICD-10-CM | POA: Diagnosis not present

## 2017-11-24 DIAGNOSIS — Z23 Encounter for immunization: Secondary | ICD-10-CM

## 2017-11-24 DIAGNOSIS — E7800 Pure hypercholesterolemia, unspecified: Secondary | ICD-10-CM

## 2017-11-24 NOTE — Patient Instructions (Signed)
Health Maintenance for Postmenopausal Women Menopause is a normal process in which your reproductive ability comes to an end. This process happens gradually over a span of months to years, usually between the ages of 22 and 9. Menopause is complete when you have missed 12 consecutive menstrual periods. It is important to talk with your health care provider about some of the most common conditions that affect postmenopausal women, such as heart disease, cancer, and bone loss (osteoporosis). Adopting a healthy lifestyle and getting preventive care can help to promote your health and wellness. Those actions can also lower your chances of developing some of these common conditions. What should I know about menopause? During menopause, you may experience a number of symptoms, such as:  Moderate-to-severe hot flashes.  Night sweats.  Decrease in sex drive.  Mood swings.  Headaches.  Tiredness.  Irritability.  Memory problems.  Insomnia.  Choosing to treat or not to treat menopausal changes is an individual decision that you make with your health care provider. What should I know about hormone replacement therapy and supplements? Hormone therapy products are effective for treating symptoms that are associated with menopause, such as hot flashes and night sweats. Hormone replacement carries certain risks, especially as you become older. If you are thinking about using estrogen or estrogen with progestin treatments, discuss the benefits and risks with your health care provider. What should I know about heart disease and stroke? Heart disease, heart attack, and stroke become more likely as you age. This may be due, in part, to the hormonal changes that your body experiences during menopause. These can affect how your body processes dietary fats, triglycerides, and cholesterol. Heart attack and stroke are both medical emergencies. There are many things that you can do to help prevent heart disease  and stroke:  Have your blood pressure checked at least every 1-2 years. High blood pressure causes heart disease and increases the risk of stroke.  If you are 54-22 years old, ask your health care provider if you should take aspirin to prevent a heart attack or a stroke.  Do not use any tobacco products, including cigarettes, chewing tobacco, or electronic cigarettes. If you need help quitting, ask your health care provider.  It is important to eat a healthy diet and maintain a healthy weight. ? Be sure to include plenty of vegetables, fruits, low-fat dairy products, and lean protein. ? Avoid eating foods that are high in solid fats, added sugars, or salt (sodium).  Get regular exercise. This is one of the most important things that you can do for your health. ? Try to exercise for at least 150 minutes each week. The type of exercise that you do should increase your heart rate and make you sweat. This is known as moderate-intensity exercise. ? Try to do strengthening exercises at least twice each week. Do these in addition to the moderate-intensity exercise.  Know your numbers.Ask your health care provider to check your cholesterol and your blood glucose. Continue to have your blood tested as directed by your health care provider.  What should I know about cancer screening? There are several types of cancer. Take the following steps to reduce your risk and to catch any cancer development as early as possible. Breast Cancer  Practice breast self-awareness. ? This means understanding how your breasts normally appear and feel. ? It also means doing regular breast self-exams. Let your health care provider know about any changes, no matter how small.  If you are 54  or older, have a clinician do a breast exam (clinical breast exam or CBE) every year. Depending on your age, family history, and medical history, it may be recommended that you also have a yearly breast X-ray (mammogram).  If you  have a family history of breast cancer, talk with your health care provider about genetic screening.  If you are at high risk for breast cancer, talk with your health care provider about having an MRI and a mammogram every year.  Breast cancer (BRCA) gene test is recommended for women who have family members with BRCA-related cancers. Results of the assessment will determine the need for genetic counseling and BRCA1 and for BRCA2 testing. BRCA-related cancers include these types: ? Breast. This occurs in males or females. ? Ovarian. ? Tubal. This may also be called fallopian tube cancer. ? Cancer of the abdominal or pelvic lining (peritoneal cancer). ? Prostate. ? Pancreatic.  Cervical, Uterine, and Ovarian Cancer Your health care provider may recommend that you be screened regularly for cancer of the pelvic organs. These include your ovaries, uterus, and vagina. This screening involves a pelvic exam, which includes checking for microscopic changes to the surface of your cervix (Pap test).  For women ages 21-65, health care providers may recommend a pelvic exam and a Pap test every three years. For women ages 54-65, they may recommend the Pap test and pelvic exam, combined with testing for human papilloma virus (HPV), every five years. Some types of HPV increase your risk of cervical cancer. Testing for HPV may also be done on women of any age who have unclear Pap test results.  Other health care providers may not recommend any screening for nonpregnant women who are considered low risk for pelvic cancer and have no symptoms. Ask your health care provider if a screening pelvic exam is right for you.  If you have had past treatment for cervical cancer or a condition that could lead to cancer, you need Pap tests and screening for cancer for at least 20 years after your treatment. If Pap tests have been discontinued for you, your risk factors (such as having a new sexual partner) need to be  reassessed to determine if you should start having screenings again. Some women have medical problems that increase the chance of getting cervical cancer. In these cases, your health care provider may recommend that you have screening and Pap tests more often.  If you have a family history of uterine cancer or ovarian cancer, talk with your health care provider about genetic screening.  If you have vaginal bleeding after reaching menopause, tell your health care provider.  There are currently no reliable tests available to screen for ovarian cancer.  Lung Cancer Lung cancer screening is recommended for adults 69-62 years old who are at high risk for lung cancer because of a history of smoking. A yearly low-dose CT scan of the lungs is recommended if you:  Currently smoke.  Have a history of at least 30 pack-years of smoking and you currently smoke or have quit within the past 15 years. A pack-year is smoking an average of one pack of cigarettes per day for one year.  Yearly screening should:  Continue until it has been 15 years since you quit.  Stop if you develop a health problem that would prevent you from having lung cancer treatment.  Colorectal Cancer  This type of cancer can be detected and can often be prevented.  Routine colorectal cancer screening usually begins at  age 42 and continues through age 45.  If you have risk factors for colon cancer, your health care provider may recommend that you be screened at an earlier age.  If you have a family history of colorectal cancer, talk with your health care provider about genetic screening.  Your health care provider may also recommend using home test kits to check for hidden blood in your stool.  A small camera at the end of a tube can be used to examine your colon directly (sigmoidoscopy or colonoscopy). This is done to check for the earliest forms of colorectal cancer.  Direct examination of the colon should be repeated every  5-10 years until age 71. However, if early forms of precancerous polyps or small growths are found or if you have a family history or genetic risk for colorectal cancer, you may need to be screened more often.  Skin Cancer  Check your skin from head to toe regularly.  Monitor any moles. Be sure to tell your health care provider: ? About any new moles or changes in moles, especially if there is a change in a mole's shape or color. ? If you have a mole that is larger than the size of a pencil eraser.  If any of your family members has a history of skin cancer, especially at a young age, talk with your health care provider about genetic screening.  Always use sunscreen. Apply sunscreen liberally and repeatedly throughout the day.  Whenever you are outside, protect yourself by wearing long sleeves, pants, a wide-brimmed hat, and sunglasses.  What should I know about osteoporosis? Osteoporosis is a condition in which bone destruction happens more quickly than new bone creation. After menopause, you may be at an increased risk for osteoporosis. To help prevent osteoporosis or the bone fractures that can happen because of osteoporosis, the following is recommended:  If you are 46-71 years old, get at least 1,000 mg of calcium and at least 600 mg of vitamin D per day.  If you are older than age 55 but younger than age 65, get at least 1,200 mg of calcium and at least 600 mg of vitamin D per day.  If you are older than age 54, get at least 1,200 mg of calcium and at least 800 mg of vitamin D per day.  Smoking and excessive alcohol intake increase the risk of osteoporosis. Eat foods that are rich in calcium and vitamin D, and do weight-bearing exercises several times each week as directed by your health care provider. What should I know about how menopause affects my mental health? Depression may occur at any age, but it is more common as you become older. Common symptoms of depression  include:  Low or sad mood.  Changes in sleep patterns.  Changes in appetite or eating patterns.  Feeling an overall lack of motivation or enjoyment of activities that you previously enjoyed.  Frequent crying spells.  Talk with your health care provider if you think that you are experiencing depression. What should I know about immunizations? It is important that you get and maintain your immunizations. These include:  Tetanus, diphtheria, and pertussis (Tdap) booster vaccine.  Influenza every year before the flu season begins.  Pneumonia vaccine.  Shingles vaccine.  Your health care provider may also recommend other immunizations. This information is not intended to replace advice given to you by your health care provider. Make sure you discuss any questions you have with your health care provider. Document Released: 04/10/2005  Document Revised: 09/06/2015 Document Reviewed: 11/20/2014 Elsevier Interactive Patient Education  2018 [employer]  

## 2017-11-24 NOTE — Assessment & Plan Note (Signed)
CMET and Lipid profile today Encouraged her to consume a low fat diet Continue Atorvastatin for now 

## 2017-11-24 NOTE — Assessment & Plan Note (Signed)
Continue Triamterene HCT prn CMET today

## 2017-11-24 NOTE — Progress Notes (Signed)
Subjective:    Patient ID: Tonya Anderson, female    DOB: 08/12/1965, 52 y.o.   MRN: [id number]  HPI  Pt presents to the clinic today for her annual exam. She is also due to follow up chronic conditions.  Meniere's Disease: She takes Triamterene HCT on an as needed basis.  HLD: Her last LDL was 97, 10/2016. She denies myalgias on Atorvastatin. She tries to consume a low fat diet.  Flu: 10/2016 Tetanus: 04/2015 Pap Smear: 08/2015 Mammogram: 09/2017 Colon Screening: 01/2015 Vision Screening: as needed Dentist: biannually  Diet: She does eat meat. She consumes fruits and veggies daily. She occasionally consumes fried foods. She drinks coffee, water and milk. Exercise: Walking dog, bike, swim, works out at [employer]  Review of Systems      Past Medical History:  Diagnosis Date  . History of fainting spells of unknown cause   . Hyperlipidemia   . Meniere disease     Current Outpatient Medications  Medication Sig Dispense Refill  . albuterol (PROVENTIL HFA;VENTOLIN HFA) 108 (90 Base) MCG/ACT inhaler Inhale 2 puffs into the lungs every 6 (six) hours as needed for wheezing or shortness of breath. 1 Inhaler 0  . atorvastatin (LIPITOR) 10 MG tablet TAKE 1 TABLET BY MOUTH DAILY *MUST KEEP APPOINTMENT* 30 tablet 11  . doxycycline (VIBRA-TABS) 100 MG tablet Take 1 tablet (100 mg total) by mouth 2 (two) times daily. 20 tablet 0  . triamterene-hydrochlorothiazide (MAXZIDE-25) 37.5-25 MG tablet TAKE 1 TABLET BY MOUTH 2 TIMES PER WEEK 90 tablet 3   No current facility-administered medications for this visit.     Allergies  Allergen Reactions  . Ancef [Cefazolin] Anaphylaxis    Family History  Problem Relation Age of Onset  . Arthritis Mother   . Hyperlipidemia Father   . Depression Father   . Arthritis Maternal Grandmother   . Heart disease Paternal Grandfather   . Colon cancer Neg Hx     Social History   Socioeconomic History  . Marital status: Married    Spouse name: Not on  file  . Number of children: Not on file  . Years of education: Not on file  . Highest education level: Not on file  Occupational History  . Not on file  Social Needs  . Financial resource strain: Not on file  . Food insecurity:    Worry: Not on file    Inability: Not on file  . Transportation needs:    Medical: Not on file    Non-medical: Not on file  Tobacco Use  . Smoking status: Never Smoker  . Smokeless tobacco: Never Used  Substance and Sexual Activity  . Alcohol use: Yes    Alcohol/week: 0.0 standard drinks    Comment:  4-6/week beer/wine/vodka  . Drug use: No  . Sexual activity: Yes    Comment: husband has vasectomy  Lifestyle  . Physical activity:    Days per week: Not on file    Minutes per session: Not on file  . Stress: Not on file  Relationships  . Social connections:    Talks on phone: Not on file    Gets together: Not on file    Attends religious service: Not on file    Active member of club or organization: Not on file    Attends meetings of clubs or organizations: Not on file    Relationship status: Not on file  . Intimate partner violence:    Fear of current or ex partner: Not on  file    Emotionally abused: Not on file    Physically abused: Not on file    Forced sexual activity: Not on file  Other Topics Concern  . 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 Denies fever, malaise, fatigue, headache.  HEENT: Denies eye pain, eye redness, ear pain, ringing in the ears, wax buildup, runny nose, nasal congestion, bloody nose, or sore throat. Respiratory: Denies difficulty breathing, shortness of breath, cough or sputum production.   Cardiovascular: Denies chest pain, chest tightness, palpitations or swelling in the hands or feet.  Gastrointestinal: Denies abdominal pain, bloating, constipation, diarrhea or blood in the stool.  GU: Denies urgency,  frequency, pain with urination, burning sensation, blood in urine, odor or discharge. Musculoskeletal: Denies decrease in range of motion, difficulty with gait, muscle pain or joint pain and swelling.  Skin: Denies redness, rashes, lesions or ulcercations.  Neurological: Denies dizziness, difficulty with memory, difficulty with speech or problems with balance and coordination.  Psych: Denies anxiety, depression, SI/HI.  No other specific complaints in a complete review of systems (except as listed in HPI above).  Objective:   Physical Exam   BP 116/70   Pulse 76   Temp 97.9 F (36.6 C) (Oral)   Ht 5' 3.5" (1.613 m)   Wt 184 lb (83.5 kg)   SpO2 98%   BMI 32.08 kg/m  Wt Readings from Last 3 Encounters:  11/24/17 184 lb (83.5 kg)  07/01/17 181 lb 12 oz (82.4 kg)  03/22/17 179 lb 6.4 oz (81.4 kg)    General: Appears her stated age, obese, in NAD. Skin: Warm, dry and intact.  HEENT: Head: normal shape and size; Eyes: sclera white, no icterus, conjunctiva pink, PERRLA and EOMs intact; Ears: Tm's gray and intact, normal light reflex; Throat/Mouth: Teeth present, mucosa pink and moist, no exudate, lesions or ulcerations noted.  Neck:  Neck supple, trachea midline. No masses, lumps or thyromegaly present.  Cardiovascular: Normal rate and rhythm. S1,S2 noted.  No murmur, rubs or gallops noted. No JVD or BLE edema. No carotid bruits noted. Pulmonary/Chest: Normal effort and positive vesicular breath sounds. No respiratory distress. No wheezes, rales or ronchi noted.  Abdomen: Soft and nontender. Normal bowel sounds. No distention or masses noted. Liver, spleen and kidneys non palpable. Musculoskeletal: Strength 5/5 BUE/BLE. No difficulty with gait.  Neurological: Alert and oriented. Cranial nerves II-XII grossly intact. Coordination normal.  Psychiatric: Mood and affect normal. Behavior is normal. Judgment and thought content normal.   BMET    Component Value Date/Time   NA 141  11/09/2016 0944   K 4.6 11/09/2016 0944   CL 105 11/09/2016 0944   CO2 27 11/09/2016 0944   GLUCOSE 97 11/09/2016 0944   BUN 11 11/09/2016 0944   CREATININE 0.79 11/09/2016 0944   CALCIUM 9.6 11/09/2016 0944    Lipid Panel     Component Value Date/Time   CHOL 164 11/09/2016 0944   TRIG 112.0 11/09/2016 0944   HDL 44.50 11/09/2016 0944   CHOLHDL 4 11/09/2016 0944   VLDL 22.4 11/09/2016 0944   LDLCALC 97 11/09/2016 0944    CBC    Component Value Date/Time   WBC 5.1 11/09/2016 0944   RBC 4.59 11/09/2016 0944   HGB 13.6 11/09/2016 0944   HCT 41.7 11/09/2016 0944   PLT 204.0 11/09/2016 0944   MCV 90.8 11/09/2016 0944   MCHC 32.5 11/09/2016  0944   RDW 13.3 11/09/2016 0944   LYMPHSABS 2.2 11/09/2016 0944   MONOABS 0.4 11/09/2016 0944   EOSABS 0.1 11/09/2016 0944   BASOSABS 0.0 11/09/2016 0944    Hgb A1C No results found for: HGBA1C        Assessment & Plan:   Preventative Health Maintenance:  Flu shot today Tetanus UTD Pap smear UTD Mammogram UTD Colon screening UTD Encouraged her to consume a balanced diet and exercise regimen Advised her to see an eye doctor and dentist annually Will check CBC, CMET, TSH, Lipid, Vit D and B12 today  Difficulty Losing Weight:  TSH, B12 and Vit D today Check out the Fast Metabolism Diet  RTC in 1 year, sooner if needed

## 2017-11-25 DIAGNOSIS — Z23 Encounter for immunization: Secondary | ICD-10-CM | POA: Diagnosis not present

## 2017-11-25 LAB — COMPREHENSIVE METABOLIC PANEL
ALT: 16 U/L (ref 0–35)
AST: 18 U/L (ref 0–37)
Albumin: 4.5 g/dL (ref 3.5–5.2)
Alkaline Phosphatase: 76 U/L (ref 39–117)
BUN: 13 mg/dL (ref 6–23)
CO2: 28 mEq/L (ref 19–32)
Calcium: 9.9 mg/dL (ref 8.4–10.5)
Chloride: 103 mEq/L (ref 96–112)
Creatinine, Ser: 0.86 mg/dL (ref 0.40–1.20)
GFR: 72.89 mL/min (ref >60.00)
Glucose, Bld: 89 mg/dL (ref 70–99)
Potassium: 5 mEq/L (ref 3.5–5.1)
Sodium: 139 mEq/L (ref 135–145)
Total Bilirubin: 0.5 mg/dL (ref 0.2–1.2)
Total Protein: 7.5 g/dL (ref 6.0–8.3)

## 2017-11-25 LAB — CBC
HCT: 41.5 % (ref 36.0–46.0)
Hemoglobin: 14 g/dL (ref 12.0–15.0)
MCHC: 33.6 g/dL (ref 30.0–36.0)
MCV: 89.7 fl (ref 78.0–100.0)
Platelets: 220 10*3/uL (ref 150.0–400.0)
RBC: 4.63 Mil/uL (ref 3.87–5.11)
RDW: 13.6 % (ref 11.5–15.5)
WBC: 6.5 10*3/uL (ref 4.0–10.5)

## 2017-11-25 LAB — LIPID PANEL
Cholesterol: 204 mg/dL — ABNORMAL HIGH (ref 0–200)
HDL: 56.7 mg/dL (ref >39.00)
LDL Cholesterol: 114 mg/dL — ABNORMAL HIGH (ref 0–99)
NonHDL: 147.48
Total CHOL/HDL Ratio: 4
Triglycerides: 167 mg/dL — ABNORMAL HIGH (ref 0.0–149.0)
VLDL: 33.4 mg/dL (ref 0.0–40.0)

## 2017-11-25 LAB — TSH: TSH: 2.85 u[IU]/mL (ref 0.35–4.50)

## 2017-11-25 LAB — VITAMIN B12: Vitamin B-12: 193 pg/mL — ABNORMAL LOW (ref 211–911)

## 2017-11-25 LAB — VITAMIN D 25 HYDROXY (VIT D DEFICIENCY, FRACTURES): VITD: 24.27 ng/mL — ABNORMAL LOW (ref 30.00–100.00)

## 2017-12-03 ENCOUNTER — Other Ambulatory Visit: Payer: Self-pay | Admitting: Family Medicine

## 2017-12-03 NOTE — Telephone Encounter (Signed)
Please see rx refill request. Thank you 

## 2018-11-28 ENCOUNTER — Other Ambulatory Visit: Payer: Self-pay | Admitting: Internal Medicine

## 2018-11-28 ENCOUNTER — Encounter: Payer: Self-pay | Admitting: Internal Medicine

## 2018-11-28 ENCOUNTER — Ambulatory Visit (INDEPENDENT_AMBULATORY_CARE_PROVIDER_SITE_OTHER): Payer: BC Managed Care – PPO | Admitting: Internal Medicine

## 2018-11-28 ENCOUNTER — Other Ambulatory Visit: Payer: Self-pay

## 2018-11-28 VITALS — BP 120/72 | HR 56 | Temp 98.2°F | Ht 64.0 in | Wt 162.0 lb

## 2018-11-28 DIAGNOSIS — M2041 Other hammer toe(s) (acquired), right foot: Secondary | ICD-10-CM | POA: Diagnosis not present

## 2018-11-28 DIAGNOSIS — R202 Paresthesia of skin: Secondary | ICD-10-CM | POA: Diagnosis not present

## 2018-11-28 DIAGNOSIS — Z780 Asymptomatic menopausal state: Secondary | ICD-10-CM | POA: Diagnosis not present

## 2018-11-28 DIAGNOSIS — Z0001 Encounter for general adult medical examination with abnormal findings: Secondary | ICD-10-CM

## 2018-11-28 DIAGNOSIS — E7800 Pure hypercholesterolemia, unspecified: Secondary | ICD-10-CM | POA: Diagnosis not present

## 2018-11-28 DIAGNOSIS — Z23 Encounter for immunization: Secondary | ICD-10-CM

## 2018-11-28 DIAGNOSIS — L989 Disorder of the skin and subcutaneous tissue, unspecified: Secondary | ICD-10-CM

## 2018-11-28 DIAGNOSIS — H8102 Meniere's disease, left ear: Secondary | ICD-10-CM

## 2018-11-28 DIAGNOSIS — Z0000 Encounter for general adult medical examination without abnormal findings: Secondary | ICD-10-CM

## 2018-11-28 LAB — LIPID PANEL
Cholesterol: 147 mg/dL (ref 0–200)
HDL: 54.3 mg/dL (ref >39.00)
LDL Cholesterol: 78 mg/dL (ref 0–99)
NonHDL: 93.17
Total CHOL/HDL Ratio: 3
Triglycerides: 74 mg/dL (ref 0.0–149.0)
VLDL: 14.8 mg/dL (ref 0.0–40.0)

## 2018-11-28 LAB — COMPREHENSIVE METABOLIC PANEL
ALT: 13 U/L (ref 0–35)
AST: 16 U/L (ref 0–37)
Albumin: 4.5 g/dL (ref 3.5–5.2)
Alkaline Phosphatase: 73 U/L (ref 39–117)
BUN: 12 mg/dL (ref 6–23)
CO2: 30 mEq/L (ref 19–32)
Calcium: 9.9 mg/dL (ref 8.4–10.5)
Chloride: 105 mEq/L (ref 96–112)
Creatinine, Ser: 0.79 mg/dL (ref 0.40–1.20)
GFR: 75.36 mL/min (ref >60.00)
Glucose, Bld: 103 mg/dL — ABNORMAL HIGH (ref 70–99)
Potassium: 5 mEq/L (ref 3.5–5.1)
Sodium: 140 mEq/L (ref 135–145)
Total Bilirubin: 0.4 mg/dL (ref 0.2–1.2)
Total Protein: 6.9 g/dL (ref 6.0–8.3)

## 2018-11-28 LAB — VITAMIN D 25 HYDROXY (VIT D DEFICIENCY, FRACTURES): VITD: 28.13 ng/mL — ABNORMAL LOW (ref 30.00–100.00)

## 2018-11-28 LAB — CBC
HCT: 41.3 % (ref 36.0–46.0)
Hemoglobin: 13.5 g/dL (ref 12.0–15.0)
MCHC: 32.7 g/dL (ref 30.0–36.0)
MCV: 92 fl (ref 78.0–100.0)
Platelets: 194 10*3/uL (ref 150.0–400.0)
RBC: 4.48 Mil/uL (ref 3.87–5.11)
RDW: 13.5 % (ref 11.5–15.5)
WBC: 5.4 10*3/uL (ref 4.0–10.5)

## 2018-11-28 LAB — VITAMIN B12: Vitamin B-12: 205 pg/mL — ABNORMAL LOW (ref 211–911)

## 2018-11-28 NOTE — Progress Notes (Signed)
Subjective:    Patient ID: Tonya BoresCindy Beauchesne, female    DOB: 1965-04-25, 53 y.o.   MRN: [id number]  HPI  Pt presents to the clinic today for her annual exam.  Meniere's Disease: Managed with Triamterene-HCT as needed. She is not following with ENT.  HLD: Her last LDL was 114, triglycerides 167, 10/2017. She denies myalgias on Atorvastatin. She tries to consume a low fat diet.  Flu: 10/2017 Tetanus: 08/2015 Pap Smear: 08/2015 Mammogram: 09/2017, every 18 months Colon Screening: 01/2015 Bone Density: never Vision Screening: annually Dentist: biannually  Diet: She does eat meat. She consumes fruits and veggies daily. She rarely eats fried foods. She drinks mostly water, hot herbal tea, ETOH on Friday nights. Exercise: Yoga, swimming, biking, walking 7 day per week  Review of Systems  Past Medical History:  Diagnosis Date  . History of fainting spells of unknown cause   . Hyperlipidemia   . Meniere disease     Current Outpatient Medications  Medication Sig Dispense Refill  . albuterol (PROVENTIL HFA;VENTOLIN HFA) 108 (90 Base) MCG/ACT inhaler Inhale 2 puffs into the lungs every 6 (six) hours as needed for wheezing or shortness of breath. 1 Inhaler 0  . atorvastatin (LIPITOR) 10 MG tablet TAKE 1 TABLET BY MOUTH DAILY 90 tablet 3  . triamterene-hydrochlorothiazide (MAXZIDE-25) 37.5-25 MG tablet TAKE 1 TABLET BY MOUTH 2 TIMES PER WEEK 90 tablet 3   No current facility-administered medications for this visit.     Allergies  Allergen Reactions  . Ancef [Cefazolin] Anaphylaxis    Family History  Problem Relation Age of Onset  . Arthritis Mother   . Hyperlipidemia Father   . Depression Father   . Arthritis Maternal Grandmother   . Heart disease Paternal Grandfather   . Colon cancer Neg Hx     Social History   Socioeconomic History  . Marital status: Married    Spouse name: Not on file  . Number of children: Not on file  . Years of education: Not on file  . Highest  education level: Not on file  Occupational History  . Not on file  Social Needs  . Financial resource strain: Not on file  . Food insecurity    Worry: Not on file    Inability: Not on file  . Transportation needs    Medical: Not on file    Non-medical: Not on file  Tobacco Use  . Smoking status: Never Smoker  . Smokeless tobacco: Never Used  Substance and Sexual Activity  . Alcohol use: Yes    Alcohol/week: 0.0 standard drinks    Comment:  4-6/week beer/wine/vodka  . Drug use: No  . Sexual activity: Yes    Comment: husband has vasectomy  Lifestyle  . Physical activity    Days per week: Not on file    Minutes per session: Not on file  . Stress: Not on file  Relationships  . Social Musicianconnections    Talks on phone: Not on file    Gets together: Not on file    Attends religious service: Not on file    Active member of club or organization: Not on file    Attends meetings of clubs or organizations: Not on file    Relationship status: Not on file  . Intimate partner violence    Fear of current or ex partner: Not on file    Emotionally abused: Not on file    Physically abused: Not on file    Forced sexual activity: Not  on file  Other Topics Concern  . 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 Denies fever, malaise, fatigue, headache.  HEENT: Denies eye pain, eye redness, ear pain, ringing in the ears, wax buildup, runny nose, nasal congestion, bloody nose, or sore throat. Respiratory: Denies difficulty breathing, shortness of breath, cough or sputum production.   Cardiovascular: Denies chest pain, chest tightness, palpitations or swelling in the hands or feet.  Gastrointestinal: Denies abdominal pain, bloating, constipation, diarrhea or blood in the stool.  GU: Denies urgency, frequency, pain with urination, burning sensation, blood in urine, odor or discharge.  Musculoskeletal: Pt reports hammer toes of bilateral feet. Denies decrease in range of motion, difficulty with gait, muscle pain or joint pain and swelling.  Skin: Pt reports skin lesion of chest. Denies redness, rashes, or ulcercations.  Neurological: Pt reports intermittent dizziness, numbness and tingling of BUE. Denies difficulty with memory, difficulty with speech or problems with balance and coordination.  Psych: Denies anxiety, depression, SI/HI.  No other specific complaints in a complete review of systems (except as listed in HPI above).     Objective:   Physical Exam   BP 120/72   Pulse (!) 56   Temp 98.2 F (36.8 C) (Temporal)   Ht 5\' 4"  (1.626 m)   Wt 162 lb (73.5 kg)   SpO2 98%   BMI 27.81 kg/m  Wt Readings from Last 3 Encounters:  11/28/18 162 lb (73.5 kg)  11/24/17 184 lb (83.5 kg)  07/01/17 181 lb 12 oz (82.4 kg)    General: Appears her stated age, well developed, well nourished in NAD. Skin: Warm, dry and intact. 0.5 cm oval shaped raised flesh colored lesion overlying the sternum. HEENT: Head: normal shape and size; Eyes: sclera white, no icterus, conjunctiva pink, PERRLA and EOMs intact; Ears: Tm's gray and intact, normal light reflex;  Neck:  Neck supple, trachea midline. No masses, lumps or thyromegaly present.  Cardiovascular: Normal rate and rhythm. S1,S2 noted.  No murmur, rubs or gallops noted. No JVD or BLE edema. No carotid bruits noted. Pulmonary/Chest: Normal effort and positive vesicular breath sounds. No respiratory distress. No wheezes, rales or ronchi noted.  Abdomen: Soft and nontender. Normal bowel sounds. No distention or masses noted. Liver, spleen and kidneys non palpable. Musculoskeletal: Strength 5/5 BUE/BLE. Hammer toe, 2nd digit, right foot. No difficulty with gait.  Neurological: Alert and oriented. Cranial nerves II-XII grossly intact. Negative Phalen's. Negative Tinel's. Coordination normal.  Psychiatric: Mood and affect normal.  Behavior is normal. Judgment and thought content normal.     BMET    Component Value Date/Time   NA 139 11/24/2017 1508   K 5.0 11/24/2017 1508   CL 103 11/24/2017 1508   CO2 28 11/24/2017 1508   GLUCOSE 89 11/24/2017 1508   BUN 13 11/24/2017 1508   CREATININE 0.86 11/24/2017 1508   CALCIUM 9.9 11/24/2017 1508    Lipid Panel     Component Value Date/Time   CHOL 204 (H) 11/24/2017 1508   TRIG 167.0 (H) 11/24/2017 1508   HDL 56.70 11/24/2017 1508   CHOLHDL 4 11/24/2017 1508   VLDL 33.4 11/24/2017 1508   LDLCALC 114 (H) 11/24/2017 1508    CBC    Component Value Date/Time   WBC 6.5 11/24/2017 1508   RBC 4.63 11/24/2017 1508   HGB 14.0 11/24/2017 1508   HCT 41.5 11/24/2017 1508   PLT 220.0  11/24/2017 1508   MCV 89.7 11/24/2017 1508   MCHC 33.6 11/24/2017 1508   RDW 13.6 11/24/2017 1508   LYMPHSABS 2.2 11/09/2016 0944   MONOABS 0.4 11/09/2016 0944   EOSABS 0.1 11/09/2016 0944   BASOSABS 0.0 11/09/2016 0944    Hgb A1C No results found for: HGBA1C         Assessment & Plan:   Preventative Health Maintenance:  Flu shot today Tetanus UTD Pap smear due 2022 She will schedule her mammogram for 05/2019 (every 18 months) Bone density ordered- she will schedule with her mammogram Colon screening UTD Encouraged her to consume a balanced diet and exercise regimen Advised her to see an eye doctor and dentist annually Will check CBC, CMET, Lipid and Vit D today  Paresthesia of BUE:  Exam negative for carpal tunnel Cardiovascularly intact Will check Vit D and B12 today  Hammer Toes:  Referral to podiatry placed  Skin Lesion of Chest Wall:  Procedure Note:  Procedure, risks and benefits were explained to the patient and verbal consent was obtained.  Area was cleasned with bBetadine swabs x2. Lidocaine with epinephrine was injected to form two wheals surrounding the lesion. A shave blade was use to biopsy the lesion. Pressure was applied, cauterization  was required to achieve hemostasis. Triple antibiotic ointment was applied and the site was dressed with a bandage. Instructions for wound care were explained to the patients satisfaction.  RTC in 1 year, sooner if needed Webb Silversmith, NP

## 2018-11-28 NOTE — Assessment & Plan Note (Signed)
CMET and Lipid profile today Encouraged her to consume a low fat diet Continue Atorvastatin for now 

## 2018-11-28 NOTE — Assessment & Plan Note (Signed)
Controlled on Triamterene HCT Will monitor

## 2018-11-28 NOTE — Patient Instructions (Signed)

## 2018-12-05 ENCOUNTER — Telehealth: Payer: Self-pay

## 2018-12-05 NOTE — Telephone Encounter (Signed)
[facility] Night - Client TELEPHONE ADVICE RECORD [facility] Patient Name: Tonya Anderson Gender: Female DOB: 1965-08-03 Age: 53 Y 61 M 20 D Return Phone Number: [phone] (Primary), [phone] (Secondary) Address: [place] [address] Client [facility] Night - Client Client Site [place] Physician Webb Silversmith - NP Contact Type Call Who Is Calling Patient / Member / Family / Caregiver Call Type Triage / Clinical Relationship To Patient Self Return Phone Number [phone] (Primary) Chief Complaint Infection Exposure (non-symptomatic) Reason for Call Symptomatic / Request for [place] states she may have been exposed to Covid. She does not have symptoms. She would like to know where to get tested. Translation No Nurse Assessment Nurse: Lucky Cowboy, RN, Levada Dy Date/Time (Eastern Time): 12/04/2018 1:41:48 PM Confirm and document reason for call. If symptomatic, describe symptoms. ---Caller is asking about COVID-19 exposure yesterday. No s/s-- fever, cough, runny/stuffy nose, n/v/d, headache, sore throat, or rash. Has the patient had close contact with a person known or suspected to have the novel coronavirus illness OR traveled / lives in area with major community spread (including international travel) in the last 14 days from the onset of symptoms? * If Asymptomatic, screen for exposure and travel within the last 14 days. ---Yes Does the patient have any new or worsening symptoms? ---Yes Will a triage be completed? ---Yes Related visit to physician within the last 2 weeks? ---Yes Does the PT have any chronic conditions? (i.e. diabetes, asthma, this includes High risk factors for pregnancy, etc.) ---Yes List chronic conditions. ---high cholesterol Is this a behavioral health or substance abuse call? ---No Guidelines Guideline Title Affirmed Question  Affirmed Notes Nurse Date/Time (Eastern Time) Coronavirus (COVID-19) - Exposure [1] COVID-19 EXPOSURE (Close Contact) AND [2] within last 14 days BUT [3] NO symptoms Dew, RN, Levada Dy 12/04/2018 1:43:40 PM PLEASE NOTE: All timestamps contained within this report are represented as [place] Standard Time. CONFIDENTIALTY NOTICE: This fax transmission is intended only for the addressee. It contains information that is legally privileged, confidential or otherwise protected from use or disclosure. If you are not the intended recipient, you are strictly prohibited from reviewing, disclosing, copying using or disseminating any of this information or taking any action in reliance on or regarding this information. If you have received this fax in error, please notify us immediately by telephone so that we can arrange for its return to [place]. Phone: [phone], Toll-Free: [phone], Fax: [phone] Page: 2 of 2 Call Id: [id number] [place]. Time Eilene Ghazi Time) Disposition Final User 12/04/2018 1:45:55 PM Home Care Yes Dew, RN, Marin Shutter Disagree/Comply Comply Caller Understands Yes PreDisposition Did not know what to do Care Advice Given Per Guideline HOME CARE: * You should be able to treat this at home. REASSURANCE AND EDUCATION - EXPOSED, NO SYMPTOMS, LESS THAN 14 DAYS: * Although you were exposed to COVID-19, you do not currently have any of the common symptoms of COVID-19 infection, such as: cough, fever, and shortness of breath. * COVID-19 starts within 14 days of exposure. * Stay at home (quarantine). Do not go to work until 14 days after the exposure. * MONITOR YOUR SYMPTOMS UNTIL 14 DAYS HAVE PASSED. Check your temperature two times a day. * Call your healthcare provider if you develop a cough, fever, or shortness of breath. Call your healthcare provider if you develop any other symptoms compatible with a possible diagnosis of COVID-19. COVID-19 - SYMPTOMS: * COVID-19 most often causes a  respiratory illness. * The most common symptoms are: cough, fever, and shortness of breath. * Other less common symptoms are: chills, fatigue, headache, loss of smell or taste, muscle pain, and sore throat. * Some people may have minimal symptoms or even have no symptoms (asymptomatic). COVID-19 - INFORMATION ABOUT TESTING: * Testing requires a healthcare provider's (e.g., doctor, NP, PA) order, just like all medical tests. MEASURE TEMPERATURE: * Watch for symptoms of cough and fever. * Measure your temperature 2 times each day, until 14 days after exposure. * Report any fever or other concerning symptoms to your healthcare provider. HOME ISOLATION NEEDED IF SYMPTOMS OCCUR: * Isolation will be needed if you develop symptoms within 14 days of COVID-19 exposure: * Isolate yourself at home. * Do NOT allow any visitors. * Do NOT go to work or school. * Do NOT go to religious services, child care centers, shopping, or other public places. CALL BACK (OR CALL YOUR HEALTHCARE PROVIDER) IF: * Fever or feeling feverish occurs within 14 days of COVID-19 exposure. * Cough or difficulty breathing occur within 14 days of COVID-19 exposure. * Other symptoms of COVID-19 infection occur. * You have more questions. CARE ADVICE given per Coronavirus (COVID-19) - Exposure (Adult) guideline.

## 2018-12-07 MED ORDER — TRIAMTERENE-HCTZ 37.5-25 MG PO TABS: ORAL_TABLET | ORAL | 2 refills | Status: DC

## 2018-12-07 MED ORDER — ATORVASTATIN CALCIUM 10 MG PO TABS: 10.0000 mg | ORAL_TABLET | Freq: Every day | ORAL | 2 refills | Status: DC

## 2018-12-07 NOTE — Addendum Note (Signed)
Addended by: Lurlean Nanny on: 12/07/2018 10:31 AM   Modules accepted: Orders

## 2019-01-04 ENCOUNTER — Other Ambulatory Visit: Payer: Self-pay

## 2019-01-04 ENCOUNTER — Ambulatory Visit (INDEPENDENT_AMBULATORY_CARE_PROVIDER_SITE_OTHER): Payer: 59

## 2019-01-04 ENCOUNTER — Ambulatory Visit (INDEPENDENT_AMBULATORY_CARE_PROVIDER_SITE_OTHER): Payer: 59 | Admitting: Podiatry

## 2019-01-04 ENCOUNTER — Encounter: Payer: Self-pay | Admitting: Podiatry

## 2019-01-04 VITALS — BP 106/64 | HR 64 | Resp 16

## 2019-01-04 DIAGNOSIS — M2041 Other hammer toe(s) (acquired), right foot: Secondary | ICD-10-CM

## 2019-01-04 DIAGNOSIS — M2042 Other hammer toe(s) (acquired), left foot: Secondary | ICD-10-CM

## 2019-01-04 NOTE — Progress Notes (Signed)
  Subjective:  Patient ID: Tonya Anderson, female    DOB: [birth year],  MRN: [id number] HPI Chief Complaint  Patient presents with  . Toe Pain    2nd toe right - hammer toe deformity x years, concerned about it worsening, rubs in dress type shoes  . New Patient (Initial Visit)    53 y.o. female presents with the above complaint.   ROS: Denies fever chills nausea vomiting muscle aches pains calf pain back pain chest pain shortness of breath.  Past Medical History:  Diagnosis Date  . History of fainting spells of unknown cause   . Hyperlipidemia   . Meniere disease    Past Surgical History:  Procedure Laterality Date  . BUNIONECTOMY    . CERVICAL CERCLAGE  1994 and 1995  . OVARY SURGERY  1986   removed cyst and ovary    Current Outpatient Medications:  .  atorvastatin (LIPITOR) 10 MG tablet, Take 1 tablet (10 mg total) by mouth daily., Disp: 90 tablet, Rfl: 2 .  triamterene-hydrochlorothiazide (MAXZIDE-25) 37.5-25 MG tablet, TAKE 1 TABLET BY MOUTH 2 TIMES PER WEEK, Disp: 90 tablet, Rfl: 2  Allergies  Allergen Reactions  . Ancef [Cefazolin] Anaphylaxis   Review of Systems Objective:   Vitals:   01/04/19 1446  BP: 106/64  Pulse: 64  Resp: 16    General: Well developed, nourished, in no acute distress, alert and oriented x3   Dermatological: Skin is warm, dry and supple bilateral. Nails x 10 are well maintained; remaining integument appears unremarkable at this time. There are no open sores, no preulcerative lesions, no rash or signs of infection present.  Vascular: Dorsalis Pedis artery and Posterior Tibial artery pedal pulses are 2/4 bilateral with immedate capillary fill time. Pedal hair growth present. No varicosities and no lower extremity edema present bilateral.   Neruologic: Grossly intact via light touch bilateral. Vibratory intact via tuning fork bilateral. Protective threshold with Semmes Wienstein monofilament intact to all pedal sites bilateral. Patellar and  Achilles deep tendon reflexes 2+ bilateral. No Babinski or clonus noted bilateral.   Musculoskeletal: No gross boney pedal deformities bilateral. No pain, crepitus, or limitation noted with foot and ankle range of motion bilateral. Muscular strength 5/5 in all groups tested bilateral.  Flexible hammertoe deformities #2 #3 medial deviation left foot.  Similar findings that of the right foot.  Most likely she has rupture of the lateral collateral ligament or plantar plate.  Gait: Unassisted, Nonantalgic.    Radiographs:  Radiographs taken today demonstrate corrected hallux valgus deformities with internal fixation.  Elongated second and third metatarsals left.  This is resulting in medial deviation of the second toe and a hammertoe deformity of the second and third toes.  Assessment & Plan:   Assessment: Plantarflexed elongated second and third metatarsals left foot with hammertoe deformities #2 #3 probable plantar plate disruption.  Plan: Discussed etiology pathology conservative versus surgical therapies at this point discussed surgery in great detail with her today discussed conservative therapies including stiff soled shoes.  I will follow-up with her in the future for surgery.     Reizel Calzada T. [place], [place]

## 2019-03-15 ENCOUNTER — Other Ambulatory Visit: Payer: Self-pay | Admitting: Internal Medicine

## 2019-03-15 DIAGNOSIS — Z1231 Encounter for screening mammogram for malignant neoplasm of breast: Secondary | ICD-10-CM

## 2019-06-02 ENCOUNTER — Other Ambulatory Visit: Payer: BC Managed Care – PPO

## 2019-06-02 ENCOUNTER — Ambulatory Visit: Payer: BC Managed Care – PPO

## 2019-08-11 ENCOUNTER — Ambulatory Visit: Payer: Self-pay

## 2019-08-11 ENCOUNTER — Other Ambulatory Visit: Payer: Self-pay

## 2019-09-13 ENCOUNTER — Encounter: Payer: Self-pay | Admitting: Internal Medicine

## 2019-09-18 ENCOUNTER — Other Ambulatory Visit: Payer: Self-pay | Admitting: Internal Medicine

## 2019-11-30 ENCOUNTER — Other Ambulatory Visit: Payer: Self-pay

## 2019-11-30 ENCOUNTER — Ambulatory Visit (INDEPENDENT_AMBULATORY_CARE_PROVIDER_SITE_OTHER): Payer: 59 | Admitting: Internal Medicine

## 2019-11-30 ENCOUNTER — Encounter: Payer: Self-pay | Admitting: Internal Medicine

## 2019-11-30 VITALS — BP 108/74 | HR 69 | Temp 97.9°F | Ht 63.75 in | Wt 174.0 lb

## 2019-11-30 DIAGNOSIS — H8102 Meniere's disease, left ear: Secondary | ICD-10-CM

## 2019-11-30 DIAGNOSIS — B070 Plantar wart: Secondary | ICD-10-CM | POA: Diagnosis not present

## 2019-11-30 DIAGNOSIS — Z0000 Encounter for general adult medical examination without abnormal findings: Secondary | ICD-10-CM | POA: Diagnosis not present

## 2019-11-30 DIAGNOSIS — M2550 Pain in unspecified joint: Secondary | ICD-10-CM

## 2019-11-30 DIAGNOSIS — E7800 Pure hypercholesterolemia, unspecified: Secondary | ICD-10-CM

## 2019-11-30 LAB — CBC
HCT: 41.1 % (ref 36.0–46.0)
Hemoglobin: 13.5 g/dL (ref 12.0–15.0)
MCHC: 32.9 g/dL (ref 30.0–36.0)
MCV: 91 fl (ref 78.0–100.0)
Platelets: 218 10*3/uL (ref 150.0–400.0)
RBC: 4.51 Mil/uL (ref 3.87–5.11)
RDW: 13.2 % (ref 11.5–15.5)
WBC: 5.1 10*3/uL (ref 4.0–10.5)

## 2019-11-30 LAB — COMPREHENSIVE METABOLIC PANEL
ALT: 14 U/L (ref 0–35)
AST: 17 U/L (ref 0–37)
Albumin: 4.5 g/dL (ref 3.5–5.2)
Alkaline Phosphatase: 70 U/L (ref 39–117)
BUN: 12 mg/dL (ref 6–23)
CO2: 31 mEq/L (ref 19–32)
Calcium: 9.5 mg/dL (ref 8.4–10.5)
Chloride: 102 mEq/L (ref 96–112)
Creatinine, Ser: 0.85 mg/dL (ref 0.40–1.20)
GFR: 69.01 mL/min (ref >60.00)
Glucose, Bld: 97 mg/dL (ref 70–99)
Potassium: 4.8 mEq/L (ref 3.5–5.1)
Sodium: 138 mEq/L (ref 135–145)
Total Bilirubin: 0.6 mg/dL (ref 0.2–1.2)
Total Protein: 6.9 g/dL (ref 6.0–8.3)

## 2019-11-30 LAB — LIPID PANEL
Cholesterol: 193 mg/dL (ref 0–200)
HDL: 58.9 mg/dL (ref >39.00)
LDL Cholesterol: 104 mg/dL — ABNORMAL HIGH (ref 0–99)
NonHDL: 134.36
Total CHOL/HDL Ratio: 3
Triglycerides: 152 mg/dL — ABNORMAL HIGH (ref 0.0–149.0)
VLDL: 30.4 mg/dL (ref 0.0–40.0)

## 2019-11-30 LAB — VITAMIN D 25 HYDROXY (VIT D DEFICIENCY, FRACTURES): VITD: 23.17 ng/mL — ABNORMAL LOW (ref 30.00–100.00)

## 2019-11-30 LAB — SEDIMENTATION RATE: Sed Rate: 10 mm/hr (ref 0–30)

## 2019-11-30 MED ORDER — TRIAMTERENE-HCTZ 37.5-25 MG PO TABS: ORAL_TABLET | ORAL | 3 refills | Status: DC

## 2019-11-30 NOTE — Patient Instructions (Signed)

## 2019-11-30 NOTE — Assessment & Plan Note (Signed)
CMET and lipid profile today Encouraged her to consume a low fat diet Continue Atorvastatin 

## 2019-11-30 NOTE — Progress Notes (Signed)
Subjective:    Patient ID: Tonya Anderson, female    DOB: Jan 12, 1966, 54 y.o.   MRN: [id number]  HPI  Patient presents the clinic today for her annual exam.  She is also due to follow-up chronic conditions.  HLD: Her last LDL was 78, 11/2018.  She denies myalgias on Atorvastatin.  She tries to consume a low-fat diet.  Mnire's: She reports 2-3 episodes in the last year. Managed with Triamterene-HCT.  She does not currently follow with ENT or neurology.  She also has a wart to the bottom of her right foot. She noticed this 3 weeks. It is not painful. She has not tried anything OTC for this. She would like to know if it could be frozen today.  She reports joint pain in her knees, hips and shoulders. This has occurred intremittently. She has not noticed any joint swelling, redness or rash. She takes Naproxen as needed with good relief of symptoms. She has no family history of autoimmune disease.  Flu: 11/2018 Tetanus: 04/2015 Covid: Pfizer Pap smear: 08/2015 Mammogram: 08/2019, [facility] Bone density: 08/2019, [facility] Colon screen: 01/2015 Vision screening: annually Dentist: biannually  Diet: She does eat meat. She consumes fruits and veggies daily. She tries to avoid fried foods.   Review of Systems  Past Medical History:  Diagnosis Date  . History of fainting spells of unknown cause   . Hyperlipidemia   . Meniere disease     Current Outpatient Medications  Medication Sig Dispense Refill  . atorvastatin (LIPITOR) 10 MG tablet TAKE 1 TABLET BY MOUTH EVERY DAY 90 tablet 0  . triamterene-hydrochlorothiazide (MAXZIDE-25) 37.5-25 MG tablet TAKE 1 TABLET BY MOUTH 2 TIMES PER WEEK 90 tablet 2   No current facility-administered medications for this visit.    Allergies  Allergen Reactions  . Ancef [Cefazolin] Anaphylaxis    Family History  Problem Relation Age of Onset  . Arthritis Mother   . Hyperlipidemia Father   . Depression Father   . Arthritis Maternal Grandmother   . Heart  disease Paternal Grandfather   . Colon cancer Neg Hx     Social History   Socioeconomic History  . Marital status: Married    Spouse name: Not on file  . Number of children: Not on file  . Years of education: Not on file  . Highest education level: Not on file  Occupational History  . Not on file  Tobacco Use  . Smoking status: Never Smoker  . Smokeless tobacco: Never Used  Substance and Sexual Activity  . Alcohol use: Yes    Alcohol/week: 0.0 standard drinks    Comment:  4-6/week beer/wine/vodka  . Drug use: No  . Sexual activity: Yes    Comment: husband has vasectomy  Other Topics Concern  . 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 Difficulty of Paying Living Expenses: Not on file  Food Insecurity:   . Worried About Charity fundraiser in the Last Year: Not on file  . Ran Out of Food in the Last Year: Not on file  Transportation Needs:   . Lack of Transportation (Medical): Not on file  . Lack of Transportation (Non-Medical): Not on file  Physical Activity:   . Days of Exercise per Week: Not on file  . Minutes of Exercise per Session: Not on file  Stress:   . Feeling of Stress :  Not on file  Social Connections:   . Frequency of Communication with Friends and Family: Not on file  . Frequency of Social Gatherings with Friends and Family: Not on file  . Attends Religious Services: Not on file  . Active Member of Clubs or Organizations: Not on file  . Attends Archivist Meetings: Not on file  . Marital Status: Not on file  Intimate Partner Violence:   . Fear of Current or Ex-Partner: Not on file  . Emotionally Abused: Not on file  . Physically Abused: Not on file  . Sexually Abused: Not on file     Constitutional: Denies fever, malaise, fatigue, headache or abrupt weight changes.  HEENT: Denies eye pain, eye redness, ear pain, ringing  in the ears, wax buildup, runny nose, nasal congestion, bloody nose, or sore throat. Respiratory: Denies difficulty breathing, shortness of breath, cough or sputum production.   Cardiovascular: Denies chest pain, chest tightness, palpitations or swelling in the hands or feet.  Gastrointestinal: Denies abdominal pain, bloating, constipation, diarrhea or blood in the stool.  GU: Denies urgency, frequency, pain with urination, burning sensation, blood in urine, odor or discharge. Musculoskeletal: Pt reports joint pain in shoulders, hips and knees. Denies decrease in range of motion, difficulty with gait, muscle pain or joint swelling.  Skin: Pt reports wart, bottom of right foot. Denies redness, rashes, or ulcercations.  Neurological: Denies dizziness, difficulty with memory, difficulty with speech or problems with balance and coordination.  Psych: Denies anxiety, depression, SI/HI.  No other specific complaints in a complete review of systems (except as listed in HPI above).     Objective:   Physical Exam   BP 108/74   Pulse 69   Temp 97.9 F (36.6 C) (Temporal)   Ht 5' 3.75" (1.619 m)   Wt 174 lb (78.9 kg)   SpO2 98%   BMI 30.10 kg/m   Wt Readings from Last 3 Encounters:  11/28/18 162 lb (73.5 kg)  11/24/17 184 lb (83.5 kg)  07/01/17 181 lb 12 oz (82.4 kg)    General: Appears her stated age, obese, in NAD. Skin: Warm, dry and intact. 3 mm planter wart noted at the distal 5th mettarsal. HEENT: Head: normal shape and size; Eyes: sclera white, no icterus, conjunctiva pink, PERRLA and EOMs intact;   Neck:  Neck supple, trachea midline. No masses, lumps or thyromegaly present.  Cardiovascular: Normal rate and rhythm. S1,S2 noted.  No murmur, rubs or gallops noted. No JVD or BLE edema. No carotid bruits noted. Pulmonary/Chest: Normal effort and positive vesicular breath sounds. No respiratory distress. No wheezes, rales or ronchi noted.  Abdomen: Soft and nontender. Normal bowel  sounds. No distention or masses noted. Liver, spleen and kidneys non palpable. Musculoskeletal: No pain with palpation of the shoulders or knees. No joint swelling noted. Strength 5/5 BUE/BLE. No difficulty with gait.  Neurological: Alert and oriented. Cranial nerves II-XII grossly intact. Coordination normal.  Psychiatric: Mood and affect normal. Behavior is normal. Judgment and thought content normal.     BMET    Component Value Date/Time   NA 140 11/28/2018 0902   K 5.0 11/28/2018 0902   CL 105 11/28/2018 0902   CO2 30 11/28/2018 0902   GLUCOSE 103 (H) 11/28/2018 0902   BUN 12 11/28/2018 0902   CREATININE 0.79 11/28/2018 0902   CALCIUM 9.9 11/28/2018 0902    Lipid Panel     Component Value Date/Time   CHOL 147 11/28/2018 0902   TRIG 74.0 11/28/2018  0902   HDL 54.30 11/28/2018 0902   CHOLHDL 3 11/28/2018 0902   VLDL 14.8 11/28/2018 0902   LDLCALC 78 11/28/2018 0902    CBC    Component Value Date/Time   WBC 5.4 11/28/2018 0902   RBC 4.48 11/28/2018 0902   HGB 13.5 11/28/2018 0902   HCT 41.3 11/28/2018 0902   PLT 194.0 11/28/2018 0902   MCV 92.0 11/28/2018 0902   MCHC 32.7 11/28/2018 0902   RDW 13.5 11/28/2018 0902   LYMPHSABS 2.2 11/09/2016 0944   MONOABS 0.4 11/09/2016 0944   EOSABS 0.1 11/09/2016 0944   BASOSABS 0.0 11/09/2016 0944    Hgb A1C No results found for: HGBA1C         Assessment & Plan:   Preventative Health Maintenance:  Flu shot today Tetanus UTD Covid UTD Pap smear due 2022 Mammogram UTD, will request copy Bone density UTD, will request copy Colon screening UTD Encouraged her to consume a balanced diet and exercise regimen Advised her to see an eye doctor and dentist annually We will check CBC, C met, lipid and vitamin D today  Multiple Joint Pain:  OA vs PMR Will check RF, ESR, and ANA today Continue Naproxen as needed Consider use of Famotidine for stomach protection if taking Naproxen daily.  Wart of Right  Foot:  Informed consent obtained verbally Lesion frozen with cryotherapy x 8 seconds, 2 cycles Aftercare instructions given Pt tolerated procedure without complication  RTC in 1 year, sooner if needed Webb Silversmith, NP This visit occurred during the SARS-CoV-2 public health emergency.  Safety protocols were in place, including screening questions prior to the visit, additional usage of staff PPE, and extensive cleaning of exam room while observing appropriate contact time as indicated for disinfecting solutions.

## 2019-11-30 NOTE — Assessment & Plan Note (Signed)
Continue Triamterene-HCT, refilled today CMET today Will monitor

## 2019-12-01 LAB — RHEUMATOID FACTOR: Rheumatoid fact SerPl-aCnc: <14 IU/mL (ref <14)

## 2019-12-05 LAB — ANA: Anti Nuclear Antibody (ANA): NEGATIVE

## 2019-12-14 ENCOUNTER — Other Ambulatory Visit: Payer: Self-pay | Admitting: Internal Medicine

## 2020-07-12 ENCOUNTER — Other Ambulatory Visit: Payer: Self-pay

## 2020-07-12 ENCOUNTER — Encounter: Payer: Self-pay | Admitting: Primary Care

## 2020-07-12 ENCOUNTER — Ambulatory Visit (INDEPENDENT_AMBULATORY_CARE_PROVIDER_SITE_OTHER): Payer: 59 | Admitting: Primary Care

## 2020-07-12 DIAGNOSIS — B070 Plantar wart: Secondary | ICD-10-CM | POA: Diagnosis not present

## 2020-07-12 HISTORY — DX: Plantar wart: B07.0

## 2020-07-12 NOTE — Assessment & Plan Note (Signed)
Noted today. Patient requesting cryotherapy as this was effective previously.  Verbal consent obtained. Three rounds of liquid nitrogen applied. Patient tolerated well. Discussed home care instructions.

## 2020-07-12 NOTE — Patient Instructions (Signed)
Warts  Warts are small growths on the skin. They are common and can occur on many areas of the body. A person may have one wart or several warts. In many cases, warts do not require treatment. They usually go away on their own over a period of many months to a few years. If needed, warts that cause problems or do not go away on their own can be treated. What are the causes? Warts are caused by a type of virus that is called human papillomavirus (HPV).  This virus can spread from person to person through direct contact.  Warts can also spread to other areas of the body when a person scratches a wart and then scratches another area of his or her body. What increases the risk? You are more likely to develop this condition if:  You are 10-20 years old.  You have a weakened body defense system (immune system).  You are Caucasian. What are the signs or symptoms? The main symptom of this condition is small growths on the skin. Warts may:  Be round or oval or have an irregular shape.  Have a rough surface.  Range in color from skin color to light yellow, brown, or gray.  Generally be less than  inch (1.3 cm) in size.  Go away and then come back again. Most warts are painless, but some can be painful if they are large or occur in an area of the body where pressure will be applied to them, such as the bottom of the foot. How is this diagnosed? A wart can usually be diagnosed based on its appearance. In some cases, a tissue sample may be removed (biopsy) to be looked at under a microscope. How is this treated? In many cases, warts do not need treatment. Sometimes treatment is desired. If treatment is needed or desired, options may include:  Applying medicated solutions, creams, or patches to the wart. These may be over-the-counter or prescription medicines that make the skin soft so that layers will gradually shed away. In many cases, the medicine is applied one or two times per day and  covered with a bandage.  Putting duct tape over the top of the wart (occlusion). You will leave the tape in place for as long as told by your health care provider and then replace it with a new strip of tape. This is done until the wart goes away.  Freezing the wart with liquid nitrogen (cryotherapy).  Burning the wart with: ? Laser treatment. ? An electrified probe (electrocautery).  Injection of a medicine (Candida antigen) into the wart to help the body's immune system fight off the wart.  Surgery to remove the wart. Follow these instructions at home: Medicines  Apply over-the-counter and prescription medicines only as told by your health care provider.  Do not apply over-the-counter wart medicines to your face or genitals unless your health care provider tells you to do that. Lifestyle  Keep your immune system healthy. To do this: ? Eat a healthy, balanced diet. ? Get enough sleep. ? Do not use any products that contain nicotine or tobacco, such as cigarettes and e-cigarettes. If you need help quitting, ask your health care provider. General instructions  Wash your hands after you touch a wart.  Do not scratch or pick at a wart.  Avoid shaving hair that is over a wart.  Keep all follow-up visits as told by your health care provider. This is important.   Contact a health care provider   if:  Your warts do not improve after treatment.  You have redness, swelling, or pain at the site of a wart.  You have bleeding from a wart that does not stop with light pressure.  You have diabetes and you develop a wart. Summary  Warts are small growths on the skin. They are common and can occur on many areas of the body.  In many cases, warts do not need treatment. Sometimes treatment is desired. If treatment is needed or desired, there are several treatment options.  Apply over-the-counter and prescription medicines only as told by your health care provider.  Wash your hands  after you touch a wart.  Keep all follow-up visits as told by your health care provider. This is important. This information is not intended to replace advice given to you by your health care provider. Make sure you discuss any questions you have with your health care provider. Document Revised: 07/06/2017 Document Reviewed: 07/06/2017 Elsevier Patient Education  2021 Elsevier Inc.  

## 2020-07-12 NOTE — Progress Notes (Signed)
Subjective:    Patient ID: Tonya Anderson, female    DOB: 1966/01/17, 55 y.o.   MRN: [id number]  HPI  Tonya Anderson is a very pleasant 55 y.o. female patient of Nicki Reaper who presents today to discuss skin issues.   She's noticed a wart to the plantar right foot 4-6 months ago. She has a history of plantar warts in the past which responded to cryotherapy. She tried OTC treatment for about 8 weeks with minimal improvement. The wart is not painful.   She's also noticed a dark spot between her shoulder blades and wanted [place] to take a quick look. She does have a dermatologist.    Review of Systems  Skin:       Nevus, plantar wart         Past Medical History:  Diagnosis Date  . History of fainting spells of unknown cause   . Hyperlipidemia   . Meniere disease     Social History   Socioeconomic History  . Marital status: Married    Spouse name: Not on file  . Number of children: Not on file  . Years of education: Not on file  . Highest education level: Not on file  Occupational History  . Not on file  Tobacco Use  . Smoking status: Never Smoker  . Smokeless tobacco: Never Used  Substance and Sexual Activity  . Alcohol use: Yes    Alcohol/week: 0.0 standard drinks    Comment:  4-6/week beer/wine/vodka  . Drug use: No  . Sexual activity: Yes    Comment: husband has vasectomy  Other Topics Concern  . 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 BUNIONECTOMY    . CERVICAL CERCLAGE  1994 and 1995  . OVARY SURGERY  1986   removed cyst and ovary    Family History  Problem Relation Age of  Onset  . Arthritis Mother   . Hyperlipidemia Father   . Depression Father   . Arthritis Maternal Grandmother   . Heart disease Paternal Grandfather   . Colon cancer Neg Hx     Allergies  Allergen Reactions  . Ancef [Cefazolin] Anaphylaxis    Current Outpatient Medications on File Prior to Visit  Medication Sig Dispense Refill  . atorvastatin (LIPITOR) 10 MG tablet TAKE 1 TABLET BY MOUTH EVERY DAY 90 tablet 2  . triamterene-hydrochlorothiazide (MAXZIDE-25) 37.5-25 MG tablet TAKE 1 TABLET BY MOUTH 2 TIMES PER WEEK 90 tablet 3   No current facility-administered medications on file prior to visit.    BP 110/62   Pulse 76   Temp 97.7 F (36.5 C) (Temporal)   Ht 5' 3.75" (1.619 m)   Wt 172 lb (78 kg)   SpO2 99%   BMI 29.76 kg/m  Objective:   Physical Exam Pulmonary:     Effort: Pulmonary effort is normal.  Musculoskeletal:       Feet:  Skin:    General: Skin is warm and dry.     Comments: Benign appearing nevus in between shoulder blades, dark brown, flat, oval.  2 mm rounded wart to right  plantar foot, see pic.  Neurological:     Mental Status: She is alert.           Assessment & Plan:      This visit occurred during the SARS-CoV-2 public health emergency.  Safety protocols were in place, including screening questions prior to the visit, additional usage of staff PPE, and extensive cleaning of exam room while observing appropriate contact time as indicated for disinfecting solutions.

## 2020-07-26 ENCOUNTER — Encounter: Payer: Self-pay | Admitting: Primary Care

## 2020-07-26 ENCOUNTER — Other Ambulatory Visit: Payer: Self-pay

## 2020-07-26 ENCOUNTER — Ambulatory Visit (INDEPENDENT_AMBULATORY_CARE_PROVIDER_SITE_OTHER): Payer: 59 | Admitting: Primary Care

## 2020-07-26 DIAGNOSIS — B070 Plantar wart: Secondary | ICD-10-CM

## 2020-07-26 NOTE — Patient Instructions (Signed)
Let me know if no resolve!  It was a pleasure to see you today!

## 2020-07-26 NOTE — Assessment & Plan Note (Signed)
Appears improved today. Second round of cryotherapy provided today. Patient verbally consented. Tolerated well.  She will update if no improvement.

## 2020-07-26 NOTE — Progress Notes (Signed)
Subjective:    Patient ID: Tonya Anderson, female    DOB: February 08, 1966, 55 y.o.   MRN: [id number]  HPI  Tonya Anderson is a very pleasant 55 y.o. female patient of Nicki Reaper who presents today for follow up of plantar wart.   She was last evaluated on 07/12/20 for treatment of plantar wart. During this visit she endorsed trying numerous OTC treatments without success so we utilized cryotherapy.   Today she has noticed the wart feeling more firm and has noticed it is nearer to the surface, but no change in size. She denies blistering, increased pain, redness.    Review of Systems  Skin:       Plantar wart         Past Medical History:  Diagnosis Date  . History of fainting spells of unknown cause   . Hyperlipidemia   . Meniere disease     Social History   Socioeconomic History  . Marital status: Married    Spouse name: Not on file  . Number of children: Not on file  . Years of education: Not on file  . Highest education level: Not on file  Occupational History  . Not on file  Tobacco Use  . Smoking status: Never Smoker  . Smokeless tobacco: Never Used  Substance and Sexual Activity  . Alcohol use: Yes    Alcohol/week: 0.0 standard drinks    Comment:  4-6/week beer/wine/vodka  . Drug use: No  . Sexual activity: Yes    Comment: husband has vasectomy  Other Topics Concern  . 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 BUNIONECTOMY    . CERVICAL CERCLAGE  1994 and 1995  . OVARY SURGERY  1986   removed cyst and ovary    Family History  Problem Relation Age of Onset  . Arthritis Mother    . Hyperlipidemia Father   . Depression Father   . Arthritis Maternal Grandmother   . Heart disease Paternal Grandfather   . Colon cancer Neg Hx     Allergies  Allergen Reactions  . Ancef [Cefazolin] Anaphylaxis    Current Outpatient Medications on File Prior to Visit  Medication Sig Dispense Refill  . atorvastatin (LIPITOR) 10 MG tablet TAKE 1 TABLET BY MOUTH EVERY DAY 90 tablet 2  . triamterene-hydrochlorothiazide (MAXZIDE-25) 37.5-25 MG tablet TAKE 1 TABLET BY MOUTH 2 TIMES PER WEEK 90 tablet 3   No current facility-administered medications on file prior to visit.    BP 102/68   Pulse 64   Temp 97.9 F (36.6 C) (Temporal)   Ht 5\' 4"  (1.626 m)   Wt 173 lb (78.5 kg)   SpO2 98%   BMI 29.70 kg/m  Objective:   Physical Exam Skin:    General: Skin is warm and dry.     Findings: No erythema.     Comments: 0.25 cm rounded, more superficial plantar wart to right ball of foot. Non tender. Dark center. No erythema  Neurological:     Mental Status: She is alert.           Assessment &  Plan:      This visit occurred during the SARS-CoV-2 public health emergency.  Safety protocols were in place, including screening questions prior to the visit, additional usage of staff PPE, and extensive cleaning of exam room while observing appropriate contact time as indicated for disinfecting solutions.

## 2020-09-11 ENCOUNTER — Other Ambulatory Visit: Payer: Self-pay | Admitting: Internal Medicine

## 2020-09-11 NOTE — Telephone Encounter (Signed)
Left message for patient to call back. Need to know if patient is staying with [facility] (then needs appointment for TOC) or following Regina Baity to her new office. 

## 2020-09-11 NOTE — Telephone Encounter (Signed)
Noted. Thank you. Please review refill request. Last filled on 12/19/19 #90 with 2 refills. LOV 07/26/20 with Vernona Rieger for acute visit.  11/30/19 CPE with Nicki Reaper, thank you

## 2020-09-11 NOTE — Telephone Encounter (Signed)
Pt will be staying with  and will call back to schedule in mid August

## 2021-02-07 ENCOUNTER — Ambulatory Visit (INDEPENDENT_AMBULATORY_CARE_PROVIDER_SITE_OTHER): Payer: 59 | Admitting: Family

## 2021-02-07 ENCOUNTER — Other Ambulatory Visit: Payer: Self-pay

## 2021-02-07 ENCOUNTER — Encounter: Payer: Self-pay | Admitting: Family

## 2021-02-07 VITALS — BP 106/68 | HR 64 | Temp 97.3°F | Ht 64.0 in | Wt 178.0 lb

## 2021-02-07 DIAGNOSIS — Z1231 Encounter for screening mammogram for malignant neoplasm of breast: Secondary | ICD-10-CM

## 2021-02-07 DIAGNOSIS — M2550 Pain in unspecified joint: Secondary | ICD-10-CM | POA: Diagnosis not present

## 2021-02-07 DIAGNOSIS — E559 Vitamin D deficiency, unspecified: Secondary | ICD-10-CM | POA: Diagnosis not present

## 2021-02-07 DIAGNOSIS — E785 Hyperlipidemia, unspecified: Secondary | ICD-10-CM | POA: Diagnosis not present

## 2021-02-07 DIAGNOSIS — H8109 Meniere's disease, unspecified ear: Secondary | ICD-10-CM | POA: Diagnosis not present

## 2021-02-07 DIAGNOSIS — D519 Vitamin B12 deficiency anemia, unspecified: Secondary | ICD-10-CM

## 2021-02-07 MED ORDER — TRIAMTERENE-HCTZ 37.5-25 MG PO TABS: ORAL_TABLET | ORAL | 0 refills | Status: DC

## 2021-02-07 MED ORDER — MELOXICAM 7.5 MG PO TABS: 7.5000 mg | ORAL_TABLET | Freq: Every day | ORAL | 1 refills | Status: DC

## 2021-02-07 NOTE — Assessment & Plan Note (Addendum)
Work on low-cholesterol diet and exercise as tolerated.  Will order lipid panel at next visit patient to come fasting.  Atorvastatin 10 mg once every night as prescribed.

## 2021-02-07 NOTE — Assessment & Plan Note (Signed)
,   Continue current management refill sent to pharmacy for [facility], patient to continue taking as directed.  If there is increased frequency in the future for onset of Mnire's will refer again to neurosurgery and or neurology.  For now patient is stable and managed well by medication.

## 2021-02-07 NOTE — Assessment & Plan Note (Signed)
Negative auto immune work-up 10/22, negative RF, ANA, and negative CRP. new medication prescribed for meloxicam 7.5 mg patient to take daily for management of pain.  If there is no improvement we will consider referral to rheumatologist for polyarthralgia.

## 2021-02-07 NOTE — Progress Notes (Signed)
Established Patient Office Visit  Subjective:  Patient ID: Tonya Anderson, female    DOB: 07/17/1965  Age: 55 y.o. MRN: [id number]  CC:  Chief Complaint  Patient presents with   Transitions Of Care    HPI [name] is here today for a transition of care visit as well as one acute concern.  Patient was previously seeing Nicki Reaper, NP.   Pt is with acute concerns:   Bil knee pain, bil shoulder pain, bil foot pain has been going on for a few years. She does sometimes have stiffness in joints but works out less than one hour. Reviewed labs from 11/03/19 and ana, sed rate and RF were wnl . She has been taking meloxicam (her husbands at times) with some relief.   Chronic problems addressed today:  Hyperlipidemia: denies myalgias, stable on atorvastatin 10 mg QHS  Menieres: takes maxide prn which helps, asymptomatic at current. Triggers seem to be allergies , overheating, and or bending over suddenly,. Did have workup with ENT as well about 20 years ago which per pt did include cardiologist workup and ent workup. She does not think she saw a neurologist. Last episode was about 15 months ago, completely fainted and Lost consciousness anywhere from 10-30 seconds.   Past Medical History:  Diagnosis Date   History of fainting spells of unknown cause    Hyperlipidemia    Plantar wart of right foot 07/12/2020    Past Surgical History:  Procedure Laterality Date   BUNIONECTOMY     CERVICAL CERCLAGE  1994 and 1995   OVARY SURGERY Left 03/02/1984   removed cyst and ovary    Family History  Problem Relation Age of Onset   Arthritis Mother    Hyperlipidemia Father    Depression Father    Arthritis Maternal Grandmother    Heart disease Paternal Grandfather    Colon cancer Neg Hx     Social History   Socioeconomic History   Marital status: Married    Spouse name: Not on file   Number of children: Not on file   Years of education: Not on file   Highest education level: Not  on file  Occupational History   Not on file  Tobacco Use   Smoking status: Never   Smokeless tobacco: Never  Vaping Use   Vaping Use: Never used  Substance and Sexual Activity   Alcohol use: Yes    Alcohol/week: 0.0 standard drinks    Comment:  4-6/week beer/wine/vodka   Drug use: No   Sexual activity: Yes    Partners: Male    Birth control/protection: Post-menopausal    Comment: husband has vasectomy  Other Topics Concern   Not on file  Social History Narrative   Psychologist, 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 Prn arthralgia     triamterene-hydrochlorothiazide (MAXZIDE-25) 37.5-25 MG tablet TAKE 1 TABLET BY MOUTH 2 TIMES PER WEEK 90 tablet 3   No facility-administered medications prior to visit.  Allergies  Allergen Reactions   Ancef [Cefazolin] Anaphylaxis    ROS Review of Systems  Review of Systems  Respiratory:  Negative for shortness of breath.   Cardiovascular:  Negative for chest pain and palpitations.  Gastrointestinal:  Negative for constipation and diarrhea.  Genitourinary:  Negative for dysuria, frequency and urgency.  Musculoskeletal:  Negative for myalgias.  Psychiatric/Behavioral:  Negative for depression and suicidal ideas.   All other systems reviewed and are negative.    Objective:    Physical Exam  Gen: NAD, resting comfortably HEENT: TMs normal bilaterally. OP clear. No thyromegaly noted.  CV: RRR with no murmurs appreciated Pulm: NWOB, CTAB with no  crackles, wheezes, or rhonchi GI: Normal bowel sounds present. Soft, Nontender, Nondistended. MSK: no edema, cyanosis, or clubbing noted Skin: warm, dry Neuro: CN2-12 grossly intact. Strength 5/5 in upper and lower extremities.  Psych: Normal affect and thought content  BP 106/68   Pulse 64   Temp (!) 97.3 F (36.3 C) (Temporal)   Ht 5\' 4"  (1.626 m)   Wt 178 lb (80.7 kg)   SpO2 96%   BMI 30.55 kg/m  Wt Readings from Last 3 Encounters:  02/07/21 178 lb (80.7 kg)  07/26/20 173 lb (78.5 kg)  07/12/20 172 lb (78 kg)     Health Maintenance Due  Topic Date Due   PAP SMEAR-Modifier  09/29/2020    There are no preventive care reminders to display for this patient.  Lab Results  Component Value Date   TSH 2.85 11/24/2017   Lab Results  Component Value Date   WBC 5.1 11/30/2019   HGB 13.5 11/30/2019   HCT 41.1 11/30/2019   MCV 91.0 11/30/2019   PLT 218.0 11/30/2019   Lab Results  Component Value Date   NA 138 11/30/2019   K 4.8 11/30/2019   CO2 31 11/30/2019   GLUCOSE 97 11/30/2019   BUN 12 11/30/2019   CREATININE 0.85 11/30/2019   BILITOT 0.6 11/30/2019   ALKPHOS 70 11/30/2019   AST 17 11/30/2019   ALT 14 11/30/2019   PROT 6.9 11/30/2019   ALBUMIN 4.5 11/30/2019   CALCIUM 9.5 11/30/2019   GFR 69.01 11/30/2019   Lab Results  Component Value Date   CHOL 193 11/30/2019   Lab Results  Component Value Date   HDL 58.90 11/30/2019   Lab Results  Component Value Date   LDLCALC 104 (H) 11/30/2019   Lab Results  Component Value Date   TRIG 152.0 (H) 11/30/2019   Lab Results  Component Value Date   CHOLHDL 3 11/30/2019   No results found for: HGBA1C    Assessment & Plan:   Problem List Items Addressed This Visit       Nervous and Auditory   Meniere's disease, bilateral    , Continue current management refill sent to pharmacy for Maxide, patient to continue taking as directed.  If there is increased frequency in the future for onset of Mnire's  will refer again to neurosurgery and or neurology.  For now patient is stable and managed well by medication.      Relevant Medications   triamterene-hydrochlorothiazide (MAXZIDE-25) 37.5-25 MG tablet     Other   HLD (hyperlipidemia)    Work on low-cholesterol diet and exercise as tolerated.  Will order lipid panel at next visit patient to come fasting.      Relevant Medications   triamterene-hydrochlorothiazide (MAXZIDE-25) 37.5-25 MG tablet   Other Relevant Orders   Lipid panel   Polyarthralgia -  Primary    Negative auto immune work-up 10/22, negative RF, ANA, and negative CRP. new medication prescribed for meloxicam 7.5 mg patient to take daily for management of pain.  If there is no improvement we will consider referral to rheumatologist for polyarthralgia.       Relevant Medications   meloxicam (MOBIC) 7.5 MG tablet   Other Visit Diagnoses     Vitamin D deficiency       Relevant Orders   VITAMIN D 25 Hydroxy (Vit-D Deficiency, Fractures)   Anemia due to vitamin B12 deficiency, unspecified B12 deficiency type       Relevant Orders   B12 and Folate Panel   CBC w/Diff   Encounter for screening mammogram for malignant neoplasm of breast           Meds ordered this encounter  Medications   meloxicam (MOBIC) 7.5 MG tablet    Sig: Take 1 tablet (7.5 mg total) by mouth daily. Prn arthralgia    Dispense:  90 tablet    Refill:  1    Order Specific Question:   Supervising Provider    Answer:   BEDSOLE, AMY E [[id number]]   triamterene-hydrochlorothiazide (MAXZIDE-25) 37.5-25 MG tablet    Sig: TAKE 1 TABLET BY MOUTH 2 TIMES PER WEEK    Dispense:  90 tablet    Refill:  0    Order Specific Question:   Supervising Provider    Answer:   BEDSOLE, AMY E [[id number]]    Follow-up: Return in about 1 month (around 03/10/2021) for CPE/pap, come fasting.    Mort Sawyers, FNP

## 2021-02-07 NOTE — Patient Instructions (Signed)
Start otc vitamin D3 2000 IU once daily as well as B12 1000 mcg once daily over the counter.   Please schedule your pap smear/annual exam upon leaving today, and when you come back please come back fasting.   It was a pleasure seeing you today! Please do not hesitate to reach out with any questions and or concerns.  Regards,   Mort Sawyers FNP-C

## 2021-03-25 ENCOUNTER — Other Ambulatory Visit: Payer: Self-pay

## 2021-03-25 ENCOUNTER — Other Ambulatory Visit (HOSPITAL_COMMUNITY)
Admission: RE | Admit: 2021-03-25 | Discharge: 2021-03-25 | Disposition: A | Discharge status: Home / Self Care | Payer: 59 | Source: Ambulatory Visit | Attending: Family | Admitting: Family

## 2021-03-25 ENCOUNTER — Ambulatory Visit (INDEPENDENT_AMBULATORY_CARE_PROVIDER_SITE_OTHER): Payer: 59 | Admitting: Family

## 2021-03-25 ENCOUNTER — Encounter: Payer: Self-pay | Admitting: Family

## 2021-03-25 VITALS — BP 124/80 | HR 72 | Temp 97.1°F | Ht 63.5 in | Wt 176.0 lb

## 2021-03-25 DIAGNOSIS — M2550 Pain in unspecified joint: Secondary | ICD-10-CM

## 2021-03-25 DIAGNOSIS — Z1231 Encounter for screening mammogram for malignant neoplasm of breast: Secondary | ICD-10-CM | POA: Diagnosis not present

## 2021-03-25 DIAGNOSIS — Z0000 Encounter for general adult medical examination without abnormal findings: Secondary | ICD-10-CM | POA: Diagnosis not present

## 2021-03-25 DIAGNOSIS — D519 Vitamin B12 deficiency anemia, unspecified: Secondary | ICD-10-CM | POA: Insufficient documentation

## 2021-03-25 DIAGNOSIS — E559 Vitamin D deficiency, unspecified: Secondary | ICD-10-CM

## 2021-03-25 DIAGNOSIS — Z124 Encounter for screening for malignant neoplasm of cervix: Secondary | ICD-10-CM | POA: Diagnosis not present

## 2021-03-25 DIAGNOSIS — E7800 Pure hypercholesterolemia, unspecified: Secondary | ICD-10-CM

## 2021-03-25 DIAGNOSIS — H8103 Meniere's disease, bilateral: Secondary | ICD-10-CM

## 2021-03-25 DIAGNOSIS — E785 Hyperlipidemia, unspecified: Secondary | ICD-10-CM

## 2021-03-25 LAB — COMPREHENSIVE METABOLIC PANEL
ALT: 15 U/L (ref 0–35)
AST: 18 U/L (ref 0–37)
Albumin: 4.6 g/dL (ref 3.5–5.2)
Alkaline Phosphatase: 71 U/L (ref 39–117)
BUN: 14 mg/dL (ref 6–23)
CO2: 31 mEq/L (ref 19–32)
Calcium: 9.7 mg/dL (ref 8.4–10.5)
Chloride: 101 mEq/L (ref 96–112)
Creatinine, Ser: 0.84 mg/dL (ref 0.40–1.20)
GFR: 76.81 mL/min (ref >60.00)
Glucose, Bld: 91 mg/dL (ref 70–99)
Potassium: 4.9 mEq/L (ref 3.5–5.1)
Sodium: 138 mEq/L (ref 135–145)
Total Bilirubin: 0.6 mg/dL (ref 0.2–1.2)
Total Protein: 7.2 g/dL (ref 6.0–8.3)

## 2021-03-25 NOTE — Assessment & Plan Note (Signed)
Ordered vitamin d lab, pending results.

## 2021-03-25 NOTE — Assessment & Plan Note (Signed)
Improving, continue with meloxican as prescribed.

## 2021-03-25 NOTE — Assessment & Plan Note (Signed)
Mammogram ordered, pending results.

## 2021-03-25 NOTE — Assessment & Plan Note (Signed)
hpv thin prep ordered, pending results.

## 2021-03-25 NOTE — Assessment & Plan Note (Signed)
Patient Counseling(The following topics were reviewed): ° -Nutrition: Stressed importance of moderation in sodium/caffeine intake, saturated fat and cholesterol, caloric balance, sufficient intake of fresh fruits, vegetables, and fiber. ° -Stressed the importance of regular exercise.  ° -Substance Abuse: Discussed cessation/primary prevention of tobacco, alcohol, or other drug use; driving or other dangerous activities under the influence; availability of treatment for abuse.  ° -Injury prevention: Discussed safety belts, safety helmets, smoke detector, smoking near bedding or upholstery.  ° -Sexuality: Discussed sexually transmitted diseases, partner selection, use of condoms, avoidance of unintended pregnancy and contraceptive alternatives.  ° -Dental health: Discussed importance of regular tooth brushing, flossing, and dental visits. ° -Health maintenance and immunizations reviewed. Please refer to Health maintenance section. ° °Return to care in 1 year for next preventative visit ° °

## 2021-03-25 NOTE — Assessment & Plan Note (Signed)
Stable

## 2021-03-25 NOTE — Patient Instructions (Addendum)
A diagnostic test was ordered for today for screening mammogram, and requested to be performed at [facility] [facility].  I have sent the order over to the facility.  Please give this center a call, and schedule your appointment to have this test completed. Once results are received, I will be in touch.    Stop by the lab prior to leaving today. I will notify you of your results once received.   Recommendations on keeping yourself healthy:  - Exercise at least 30-45 minutes a day, 3-4 days a week.  - Eat a low-fat diet with lots of fruits and vegetables, up to 7-9 servings per day.  - Seatbelts can save your life. Wear them always.  - Smoke detectors on every level of your home, check batteries every year.  - Eye Doctor - have an eye exam every 1-2 years  - Safe sex - if you may be exposed to STDs, use a condom.  - Alcohol -  If you drink, do it moderately, less than 2 drinks per day.  - Health Care Power of Attorney. Choose someone to speak for you if you are not able.  - Depression is common in our stressful world.If you're feeling down or losing interest in things you normally enjoy, please come in for a visit.  - Violence - If anyone is threatening or hurting you, please call immediately.  Due to recent changes in healthcare laws, you may see results of your imaging and/or laboratory studies on MyChart before I have had a chance to review them.  I understand that in some cases there may be results that are confusing or concerning to you. Please understand that not all results are received at the same time and often I may need to interpret multiple results in order to provide you with the best plan of care or course of treatment. Therefore, I ask that you please give me 2 business days to thoroughly review all your results before contacting my office for clarification. Should we see a critical lab result, you will be contacted sooner.   I will see you again in one year  for your annual comprehensive exam unless otherwise stated and or with acute concerns.  It was a pleasure seeing you today! Please do not hesitate to reach out with any questions and or concerns.  Regards,   Mort Sawyers

## 2021-03-25 NOTE — Assessment & Plan Note (Signed)
Ordering b12 today, pending results, pt currently taking 50 mcg b12 once daily

## 2021-03-25 NOTE — Progress Notes (Signed)
Established Patient Office Visit  Subjective:  Patient ID: Tonya Anderson, female    DOB: 07-Nov-1965  Age: 56 y.o. MRN: [id number]  CC:  Chief Complaint  Patient presents with   Annual Exam    HPI Tonya Anderson is here today for an annual comprehensive exam.   Pt is without acute concerns.   Pap: last one 08/03/2015, overdue  Chronic problems addressed today:  Polyarthralgia: started 7.5 meloxicam once daily, along with exercise and diet change, has been feeling so much better and sleeping better.   Past Medical History:  Diagnosis Date   History of fainting spells of unknown cause    Hyperlipidemia    Plantar wart of right foot 07/12/2020    Past Surgical History:  Procedure Laterality Date   BUNIONECTOMY     CERVICAL CERCLAGE  1994 and 1995   OVARY SURGERY Left 03/02/1984   removed cyst and ovary    Family History  Problem Relation Age of Onset   Arthritis Mother    Hyperlipidemia Father    Depression Father    Arthritis Maternal Grandmother    Heart disease Paternal Grandfather    Colon cancer Neg Hx     Social History   Socioeconomic History   Marital status: Married    Spouse name: Not on file   Number of children: Not on file   Years of education: Not on file   Highest education level: Not on file  Occupational History   Not on file  Tobacco Use   Smoking status: Never   Smokeless tobacco: Never  Vaping Use   Vaping Use: Never used  Substance and Sexual Activity   Alcohol use: Yes    Alcohol/week: 0.0 standard drinks    Comment:  4-6/week beer/wine/vodka   Drug use: No   Sexual activity: Yes    Partners: Male    Birth control/protection: Post-menopausal    Comment: husband has vasectomy  Other Topics Concern   Not on file  Social History Narrative   Programme researcher, 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 Prn arthralgia 90 tablet 1   triamterene-hydrochlorothiazide (MAXZIDE-25) 37.5-25 MG tablet TAKE 1 TABLET BY MOUTH 2 TIMES PER WEEK 90 tablet 0   No facility-administered medications prior to visit.    Allergies  Allergen Reactions   Ancef [Cefazolin] Anaphylaxis    ROS Review of Systems  Constitutional:  Negative for chills and fatigue.  Respiratory:  Negative for cough and shortness of breath.   Cardiovascular:  Negative for chest pain and leg swelling.  Gastrointestinal:  Negative for diarrhea and nausea.  Genitourinary:  Negative for difficulty urinating, flank pain, hematuria, menstrual problem, vaginal bleeding, vaginal discharge and vaginal pain.  Psychiatric/Behavioral:  Negative for agitation and sleep disturbance.   All other systems reviewed and are negative.  Review of Systems  Respiratory:  Negative for shortness of breath.   Cardiovascular:  Negative for chest pain and palpitations.  Gastrointestinal:  Negative  for constipation and diarrhea.  Genitourinary:  Negative for dysuria, frequency and urgency.  Musculoskeletal:  Negative for myalgias.  Psychiatric/Behavioral:  Negative for depression and suicidal ideas.   All other systems reviewed and are negative.    Objective:    Physical Exam Vitals reviewed.  Constitutional:      General: She is not in acute distress.    Appearance: Normal appearance. She is obese. She is not ill-appearing or toxic-appearing.  HENT:     Right Ear: Tympanic membrane normal.     Left Ear: Tympanic membrane normal.     Mouth/Throat:     Mouth: Mucous membranes are  moist.     Pharynx: No pharyngeal swelling.     Tonsils: No tonsillar exudate.  Eyes:     Extraocular Movements: Extraocular movements intact.     Conjunctiva/sclera: Conjunctivae normal.     Pupils: Pupils are equal, round, and reactive to light.  Neck:     Thyroid: No thyroid mass.  Cardiovascular:     Rate and Rhythm: Normal rate and regular rhythm.  Pulmonary:     Effort: Pulmonary effort is normal.     Breath sounds: Normal breath sounds.  Abdominal:     General: Abdomen is flat. Bowel sounds are normal.     Palpations: Abdomen is soft.  Genitourinary:    Vagina: Normal.     Cervix: No cervical bleeding.     Uterus: Normal. Not tender.      Adnexa: Right adnexa normal.       Right: No tenderness.         Left: No tenderness.       Rectum: Normal.  Musculoskeletal:        General: Normal range of motion.  Lymphadenopathy:     Cervical:     Right cervical: No superficial cervical adenopathy.    Left cervical: No superficial cervical adenopathy.  Skin:    General: Skin is warm.     Capillary Refill: Capillary refill takes less than 2 seconds.  Neurological:     General: No focal deficit present.     Mental Status: She is alert and oriented to person, place, and time.  Psychiatric:        Mood and Affect: Mood normal.        Behavior: Behavior normal.        Thought Content: Thought content normal.        Judgment: Judgment normal.  Breasts: breasts appear normal, no suspicious masses, no skin or nipple changes or axillary nodes.    Gen: NAD, resting comfortably HEENT: TMs normal bilaterally. OP clear. No thyromegaly noted.  CV: RRR with no murmurs appreciated Pulm: NWOB, CTAB with no crackles, wheezes, or rhonchi GI: Normal bowel sounds present. Soft, Nontender, Nondistended. MSK: no edema, cyanosis, or clubbing noted Skin: warm, dry Neuro: CN2-12 grossly intact. Strength 5/5 in upper and lower extremities.  Psych: Normal affect and thought content  BP 124/80     Pulse 72    Temp (!) 97.1 F (36.2 C) (Temporal)    Ht 5' 3.5" (1.613 m)    Wt 176 lb (79.8 kg)    SpO2 97%    BMI 30.69 kg/m  Wt Readings from Last 3 Encounters:  03/25/21 176 lb (79.8 kg)  02/07/21 178 lb (80.7 kg)  07/26/20 173 lb (78.5 kg)     Health Maintenance Due  Topic Date Due   PAP SMEAR-Modifier  09/29/2020    There are no preventive care  reminders to display for this patient.  Lab Results  Component Value Date   TSH 2.85 11/24/2017   Lab Results  Component Value Date   WBC 5.1 11/30/2019   HGB 13.5 11/30/2019   HCT 41.1 11/30/2019   MCV 91.0 11/30/2019   PLT 218.0 11/30/2019   Lab Results  Component Value Date   NA 138 11/30/2019   K 4.8 11/30/2019   CO2 31 11/30/2019   GLUCOSE 97 11/30/2019   BUN 12 11/30/2019   CREATININE 0.85 11/30/2019   BILITOT 0.6 11/30/2019   ALKPHOS 70 11/30/2019   AST 17 11/30/2019   ALT 14 11/30/2019   PROT 6.9 11/30/2019   ALBUMIN 4.5 11/30/2019   CALCIUM 9.5 11/30/2019   GFR 69.01 11/30/2019   Lab Results  Component Value Date   CHOL 193 11/30/2019   Lab Results  Component Value Date   HDL 58.90 11/30/2019   Lab Results  Component Value Date   LDLCALC 104 (H) 11/30/2019   Lab Results  Component Value Date   TRIG 152.0 (H) 11/30/2019   Lab Results  Component Value Date   CHOLHDL 3 11/30/2019   No results found for: HGBA1C    Assessment & Plan:   Problem List Items Addressed This Visit       Nervous and Auditory   Meniere's disease, bilateral    Stable.         Other   Encounter for general adult medical examination without abnormal findings    Patient Counseling(The following topics were reviewed):  -Nutrition: Stressed importance of moderation in sodium/caffeine intake, saturated fat and cholesterol, caloric balance, sufficient intake of fresh fruits, vegetables, and fiber.  -Stressed the importance of regular exercise.   -Substance Abuse: Discussed cessation/primary prevention of tobacco,  alcohol, or other drug use; driving or other dangerous activities under the influence; availability of treatment for abuse.   -Injury prevention: Discussed safety belts, safety helmets, smoke detector, smoking near bedding or upholstery.   -Sexuality: Discussed sexually transmitted diseases, partner selection, use of condoms, avoidance of unintended pregnancy and contraceptive alternatives.   -Dental health: Discussed importance of regular tooth brushing, flossing, and dental visits.  -Health maintenance and immunizations reviewed. Please refer to Health maintenance section.  Return to care in 1 year for next preventative visit       Relevant Orders   Comprehensive metabolic panel   HLD (hyperlipidemia)    Ordered lipid panel, pending results. Work on low cholesterol diet and exercise as tolerated       Polyarthralgia    Improving, continue with meloxican as prescribed.      Anemia due to vitamin B12 deficiency    Ordering b12 today, pending results, pt currently taking 50 mcg b12 once daily      Vitamin D deficiency    Ordered vitamin d lab, pending results.       Encounter for screening mammogram for malignant neoplasm of breast - Primary    Mammogram ordered, pending results.       Relevant Orders   MM 3D SCREEN BREAST BILATERAL   Screening for malignant neoplasm of cervix    hpv thin prep ordered, pending results.       Relevant Orders   Cytology - PAP    No orders of the defined types were placed in this encounter.   Follow-up: No follow-ups on file.    Eugenia Pancoast, FNP

## 2021-03-25 NOTE — Assessment & Plan Note (Signed)
Ordered lipid panel, pending results. Work on low cholesterol diet and exercise as tolerated ? ?

## 2021-03-26 LAB — CYTOLOGY - PAP
Comment: NEGATIVE
Diagnosis: NEGATIVE
High risk HPV: NEGATIVE

## 2021-03-27 ENCOUNTER — Other Ambulatory Visit: Payer: Self-pay | Admitting: Family

## 2021-03-27 ENCOUNTER — Other Ambulatory Visit (INDEPENDENT_AMBULATORY_CARE_PROVIDER_SITE_OTHER): Payer: 59

## 2021-03-27 DIAGNOSIS — E559 Vitamin D deficiency, unspecified: Secondary | ICD-10-CM

## 2021-03-27 LAB — VITAMIN D 25 HYDROXY (VIT D DEFICIENCY, FRACTURES): VITD: 24.04 ng/mL — ABNORMAL LOW (ref 30.00–100.00)

## 2021-03-27 MED ORDER — VITAMIN D (ERGOCALCIFEROL) 1.25 MG (50000 UNIT) PO CAPS: 50000.0000 [IU] | ORAL_CAPSULE | ORAL | 0 refills | Status: AC

## 2021-05-12 ENCOUNTER — Other Ambulatory Visit: Payer: Self-pay

## 2021-05-12 ENCOUNTER — Ambulatory Visit
Admission: RE | Admit: 2021-05-12 | Discharge: 2021-05-12 | Disposition: A | Discharge status: Home / Self Care | Payer: 59 | Source: Ambulatory Visit | Attending: Family | Admitting: Family

## 2021-05-12 DIAGNOSIS — Z1231 Encounter for screening mammogram for malignant neoplasm of breast: Secondary | ICD-10-CM | POA: Diagnosis not present

## 2021-05-19 ENCOUNTER — Other Ambulatory Visit: Payer: Self-pay | Admitting: *Deleted

## 2021-05-19 ENCOUNTER — Inpatient Hospital Stay
Admission: RE | Admit: 2021-05-19 | Discharge: 2021-05-19 | Disposition: A | Discharge status: Home / Self Care | Payer: Self-pay | Source: Ambulatory Visit | Attending: *Deleted | Admitting: *Deleted

## 2021-05-19 DIAGNOSIS — Z1231 Encounter for screening mammogram for malignant neoplasm of breast: Secondary | ICD-10-CM

## 2021-06-16 ENCOUNTER — Other Ambulatory Visit: Payer: Self-pay | Admitting: Family

## 2021-07-06 ENCOUNTER — Other Ambulatory Visit: Payer: Self-pay | Admitting: Family

## 2021-07-08 NOTE — Telephone Encounter (Signed)
Encourage patient to contact the pharmacy for refills or they can request refills through [facility] ? ?Did the patient contact the pharmacy: Yes ? ?LAST APPOINTMENT DATE:  03/25/2021 ? ?NEXT APPOINTMENT DATE: 09/22/2021 ? ?MEDICATION:  atorvastatin (LIPITOR) 10 MG tablet ? ?Is the patient out of medication? Yes  ? ?If not, how much is left? N/A ? ?Is this a 90 day supply: Yes ? ?PHARMACY:  [id number] - [place], [place] - [address] ?Phone Number: [phone] ? ?Let patient know to contact pharmacy at the end of the day to make sure medication is ready. ? ?Please notify patient to allow 48-72 hours to process ?  ?

## 2021-07-10 ENCOUNTER — Encounter: Payer: Self-pay | Admitting: Family

## 2021-07-10 DIAGNOSIS — E785 Hyperlipidemia, unspecified: Secondary | ICD-10-CM

## 2021-07-10 MED ORDER — ATORVASTATIN CALCIUM 10 MG PO TABS: 10.0000 mg | ORAL_TABLET | Freq: Every day | ORAL | 3 refills | Status: DC

## 2021-07-28 ENCOUNTER — Telehealth: Payer: 59 | Admitting: Physician Assistant

## 2021-07-28 DIAGNOSIS — R3989 Other symptoms and signs involving the genitourinary system: Secondary | ICD-10-CM | POA: Diagnosis not present

## 2021-07-28 MED ORDER — SULFAMETHOXAZOLE-TRIMETHOPRIM 800-160 MG PO TABS: 1.0000 | ORAL_TABLET | Freq: Two times a day (BID) | ORAL | 0 refills | Status: DC

## 2021-07-28 NOTE — Progress Notes (Signed)

## 2021-09-22 ENCOUNTER — Ambulatory Visit (INDEPENDENT_AMBULATORY_CARE_PROVIDER_SITE_OTHER): Payer: Managed Care, Other (non HMO) | Admitting: Family

## 2021-09-22 ENCOUNTER — Encounter: Payer: Self-pay | Admitting: Family

## 2021-09-22 VITALS — BP 122/76 | HR 71 | Temp 98.6°F | Resp 16 | Ht 63.5 in | Wt 185.0 lb

## 2021-09-22 DIAGNOSIS — E559 Vitamin D deficiency, unspecified: Secondary | ICD-10-CM

## 2021-09-22 DIAGNOSIS — E785 Hyperlipidemia, unspecified: Secondary | ICD-10-CM | POA: Diagnosis not present

## 2021-09-22 DIAGNOSIS — Z79899 Other long term (current) drug therapy: Secondary | ICD-10-CM

## 2021-09-22 DIAGNOSIS — H8109 Meniere's disease, unspecified ear: Secondary | ICD-10-CM

## 2021-09-22 DIAGNOSIS — D519 Vitamin B12 deficiency anemia, unspecified: Secondary | ICD-10-CM

## 2021-09-22 DIAGNOSIS — E538 Deficiency of other specified B group vitamins: Secondary | ICD-10-CM

## 2021-09-22 DIAGNOSIS — M2550 Pain in unspecified joint: Secondary | ICD-10-CM

## 2021-09-22 MED ORDER — TRIAMTERENE-HCTZ 37.5-25 MG PO TABS: ORAL_TABLET | ORAL | 3 refills | Status: DC

## 2021-09-22 MED ORDER — ATORVASTATIN CALCIUM 10 MG PO TABS: 10.0000 mg | ORAL_TABLET | Freq: Every day | ORAL | 3 refills | Status: DC

## 2021-09-22 MED ORDER — MELOXICAM 7.5 MG PO TABS: 7.5000 mg | ORAL_TABLET | Freq: Every day | ORAL | 3 refills | Status: DC

## 2021-09-22 NOTE — Assessment & Plan Note (Signed)
Ordering b12 folate pending results

## 2021-09-22 NOTE — Assessment & Plan Note (Signed)
Ordered lipid panel, pending results. Work on low cholesterol diet and exercise as tolerated ? ?

## 2021-09-22 NOTE — Assessment & Plan Note (Signed)
Ordered b12 pending results  

## 2021-09-22 NOTE — Assessment & Plan Note (Signed)
Refill mobic 7.5 mg once daily

## 2021-09-22 NOTE — Patient Instructions (Signed)
   I have created an order for lab work today during our visit.  Please schedule an appointment on your way out to return to the lab at your convenience. Please return fasting at your lab appointment (meaning you can only drink black coffee and or water prior to your appointment). I will reach out to you in regards to the labs when I receive the results.   Due to recent changes in healthcare laws, you may see results of your imaging and/or laboratory studies on MyChart before I have had a chance to review them.  I understand that in some cases there may be results that are confusing or concerning to you. Please understand that not all results are received at the same time and often I may need to interpret multiple results in order to provide you with the best plan of care or course of treatment. Therefore, I ask that you please give me 2 business days to thoroughly review all your results before contacting my office for clarification. Should we see a critical lab result, you will be contacted sooner.   It was a pleasure seeing you today! Please do not hesitate to reach out with any questions and or concerns.  Regards,   Yoseph Haile FNP-C  

## 2021-09-22 NOTE — Assessment & Plan Note (Signed)
Ordered vitamin d pending results.   

## 2021-09-22 NOTE — Assessment & Plan Note (Signed)
Ordering lfts

## 2021-09-22 NOTE — Progress Notes (Signed)
Established Patient Office Visit  Subjective:  Patient ID: Tonya Anderson, female    DOB: [birth year]  Age: 56 y.o. MRN: [id number]  CC:  Chief Complaint  Patient presents with   Medication Refill    HPI Tonya Anderson is here today for follow up.   Vitamin d def: finished loading dose, taking vitamin D that is included in her daily mvi  Vitamin b12 def: not currently taking b12 otc   HLD: due for lipid panel, taking atorvastatin tolerating well.   Polyarthralgia: suspected OA. Pt doing well with mobic 7.5 mg once daily., tolerating well.   Past Medical History:  Diagnosis Date   History of fainting spells of unknown cause    Hyperlipidemia    Plantar wart of right foot 07/12/2020    Past Surgical History:  Procedure Laterality Date   BUNIONECTOMY     CERVICAL CERCLAGE  1994 and 1995   OVARY SURGERY Left 03/02/1984   removed cyst and ovary    Family History  Problem Relation Age of Onset   Arthritis Mother    Hyperlipidemia Father    Depression Father    Arthritis Maternal Grandmother    Heart disease Paternal Grandfather    Colon cancer Neg Hx     Social History   Socioeconomic History   Marital status: Married    Spouse name: Not on file   Number of children: Not on file   Years of education: Not on file   Highest education level: Not on file  Occupational History   Not on file  Tobacco Use   Smoking status: Never   Smokeless tobacco: Never  Vaping Use   Vaping Use: Never used  Substance and Sexual Activity   Alcohol use: Yes    Alcohol/week: 0.0 standard drinks of alcohol    Comment:  4-6/week beer/wine/vodka   Drug use: No   Sexual activity: Yes    Partners: Male    Birth control/protection: Post-menopausal    Comment: husband has vasectomy  Other Topics Concern   Not on file  Social History Narrative   Psychologist, 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     atorvastatin (LIPITOR) 10 MG tablet Take 1 tablet (10 mg total) by mouth daily. 90 tablet 3   meloxicam (MOBIC) 7.5 MG tablet Take 1 tablet (7.5 mg total) by mouth daily. Prn arthralgia 90 tablet 1   triamterene-hydrochlorothiazide (MAXZIDE-25) 37.5-25 MG tablet TAKE 1 TABLET BY MOUTH 2 TIMES PER WEEK 90 tablet 0   sulfamethoxazole-trimethoprim (BACTRIM DS) 800-160 MG tablet Take 1 tablet by mouth 2 (two) times daily. (Patient not taking: Reported on 09/22/2021) 10 tablet 0   No facility-administered medications prior to visit.    Allergies  Allergen Reactions   Ancef [Cefazolin] Anaphylaxis         Objective:    Physical Exam Constitutional:      General: She is not in acute distress.    Appearance: Normal appearance. She is obese. She is not ill-appearing, toxic-appearing or diaphoretic.  Cardiovascular:  Rate and Rhythm: Normal rate and regular rhythm.  Pulmonary:     Effort: Pulmonary effort is normal.  Neurological:     General: No focal deficit present.     Mental Status: She is alert and oriented to person, place, and time.  Psychiatric:        Mood and Affect: Mood normal.        Behavior: Behavior normal.        Thought Content: Thought content normal.        Judgment: Judgment normal.       BP 122/76   Pulse 71   Temp 98.6 F (37 C)   Resp 16   Ht 5' 3.5" (1.613 m)   Wt 185 lb (83.9 kg)   SpO2 97%   BMI 32.26 kg/m  Wt Readings from Last 3 Encounters:  09/22/21 185 lb (83.9 kg)  03/25/21 176 lb (79.8 kg)  02/07/21 178 lb (80.7 kg)     Health Maintenance Due  Topic Date Due   COVID-19 Vaccine  (6 - Pfizer series) 02/12/2021    There are no preventive care reminders to display for this patient.  Lab Results  Component Value Date   TSH 2.85 11/24/2017   Lab Results  Component Value Date   WBC 5.1 11/30/2019   HGB 13.5 11/30/2019   HCT 41.1 11/30/2019   MCV 91.0 11/30/2019   PLT 218.0 11/30/2019   Lab Results  Component Value Date   NA 138 03/25/2021   K 4.9 03/25/2021   CO2 31 03/25/2021   GLUCOSE 91 03/25/2021   BUN 14 03/25/2021   CREATININE 0.84 03/25/2021   BILITOT 0.6 03/25/2021   ALKPHOS 71 03/25/2021   AST 18 03/25/2021   ALT 15 03/25/2021   PROT 7.2 03/25/2021   ALBUMIN 4.6 03/25/2021   CALCIUM 9.7 03/25/2021   GFR 76.81 03/25/2021   Lab Results  Component Value Date   CHOL 193 11/30/2019   Lab Results  Component Value Date   HDL 58.90 11/30/2019   Lab Results  Component Value Date   LDLCALC 104 (H) 11/30/2019   Lab Results  Component Value Date   TRIG 152.0 (H) 11/30/2019   Lab Results  Component Value Date   CHOLHDL 3 11/30/2019   No results found for: "HGBA1C"    Assessment & Plan:   Problem List Items Addressed This Visit       Nervous and Auditory   RESOLVED: Meniere's disease   Relevant Medications   triamterene-hydrochlorothiazide (MAXZIDE-25) 37.5-25 MG tablet     Other   HLD (hyperlipidemia)    Ordered lipid panel, pending results. Work on low cholesterol diet and exercise as tolerated       Relevant Medications   atorvastatin (LIPITOR) 10 MG tablet   triamterene-hydrochlorothiazide (MAXZIDE-25) 37.5-25 MG tablet   Other Relevant Orders   Comprehensive metabolic panel   Lipid panel   Polyarthralgia    Refill mobic 7.5 mg once daily       Relevant Medications   meloxicam (MOBIC) 7.5 MG tablet   Other Relevant Orders   Comprehensive metabolic panel   Vitamin D deficiency    Ordered vitamin d pending results.        Relevant Orders   VITAMIN D 25 Hydroxy (Vit-D Deficiency, Fractures)   On statin  therapy    Ordering lfts       Relevant Orders   Comprehensive metabolic panel   Vitamin B12 deficiency - Primary    Ordering b12  folate pending results       Relevant Orders   B12 and Folate Panel   RESOLVED: Anemia due to vitamin B12 deficiency    Ordered b12 pending results        Meds ordered this encounter  Medications   atorvastatin (LIPITOR) 10 MG tablet    Sig: Take 1 tablet (10 mg total) by mouth daily.    Dispense:  90 tablet    Refill:  3    Order Specific Question:   Supervising Provider    Answer:   BEDSOLE, AMY E [[id number]]   meloxicam (MOBIC) 7.5 MG tablet    Sig: Take 1 tablet (7.5 mg total) by mouth daily. Prn arthralgia    Dispense:  90 tablet    Refill:  3    Order Specific Question:   Supervising Provider    Answer:   BEDSOLE, AMY E [[id number]]   triamterene-hydrochlorothiazide (MAXZIDE-25) 37.5-25 MG tablet    Sig: TAKE 1 TABLET BY MOUTH 2 TIMES PER WEEK    Dispense:  90 tablet    Refill:  3    Order Specific Question:   Supervising Provider    Answer:   Ermalene Searing, AMY E [[id number]]    Follow-up: Return in about 6 months (around 03/25/2022) for follow up appointment, annual cpe .    Mort Sawyers, FNP

## 2021-09-29 ENCOUNTER — Other Ambulatory Visit (INDEPENDENT_AMBULATORY_CARE_PROVIDER_SITE_OTHER): Payer: Managed Care, Other (non HMO)

## 2021-09-29 DIAGNOSIS — E559 Vitamin D deficiency, unspecified: Secondary | ICD-10-CM

## 2021-09-29 DIAGNOSIS — E785 Hyperlipidemia, unspecified: Secondary | ICD-10-CM

## 2021-09-29 DIAGNOSIS — Z79899 Other long term (current) drug therapy: Secondary | ICD-10-CM

## 2021-09-29 DIAGNOSIS — D519 Vitamin B12 deficiency anemia, unspecified: Secondary | ICD-10-CM

## 2021-09-29 DIAGNOSIS — E538 Deficiency of other specified B group vitamins: Secondary | ICD-10-CM

## 2021-09-29 DIAGNOSIS — M2550 Pain in unspecified joint: Secondary | ICD-10-CM

## 2021-09-29 LAB — COMPREHENSIVE METABOLIC PANEL
ALT: 17 U/L (ref 0–35)
AST: 18 U/L (ref 0–37)
Albumin: 4.5 g/dL (ref 3.5–5.2)
Alkaline Phosphatase: 73 U/L (ref 39–117)
BUN: 13 mg/dL (ref 6–23)
CO2: 24 mEq/L (ref 19–32)
Calcium: 9.3 mg/dL (ref 8.4–10.5)
Chloride: 105 mEq/L (ref 96–112)
Creatinine, Ser: 0.71 mg/dL (ref 0.40–1.20)
GFR: 93.64 mL/min (ref >60.00)
Glucose, Bld: 90 mg/dL (ref 70–99)
Potassium: 4.6 mEq/L (ref 3.5–5.1)
Sodium: 139 mEq/L (ref 135–145)
Total Bilirubin: 0.5 mg/dL (ref 0.2–1.2)
Total Protein: 7.1 g/dL (ref 6.0–8.3)

## 2021-09-29 LAB — LIPID PANEL
Cholesterol: 186 mg/dL (ref 0–200)
HDL: 50.1 mg/dL (ref >39.00)
LDL Cholesterol: 104 mg/dL — ABNORMAL HIGH (ref 0–99)
NonHDL: 136.16
Total CHOL/HDL Ratio: 4
Triglycerides: 161 mg/dL — ABNORMAL HIGH (ref 0.0–149.0)
VLDL: 32.2 mg/dL (ref 0.0–40.0)

## 2021-09-29 LAB — CBC WITH DIFFERENTIAL/PLATELET
Basophils Absolute: 0.1 10*3/uL (ref 0.0–0.1)
Basophils Relative: 1.1 % (ref 0.0–3.0)
Eosinophils Absolute: 0.1 10*3/uL (ref 0.0–0.7)
Eosinophils Relative: 2 % (ref 0.0–5.0)
HCT: 41.6 % (ref 36.0–46.0)
Hemoglobin: 13.9 g/dL (ref 12.0–15.0)
Lymphocytes Relative: 41.5 % (ref 12.0–46.0)
Lymphs Abs: 2.1 10*3/uL (ref 0.7–4.0)
MCHC: 33.5 g/dL (ref 30.0–36.0)
MCV: 90.7 fl (ref 78.0–100.0)
Monocytes Absolute: 0.4 10*3/uL (ref 0.1–1.0)
Monocytes Relative: 7.9 % (ref 3.0–12.0)
Neutro Abs: 2.4 10*3/uL (ref 1.4–7.7)
Neutrophils Relative %: 47.5 % (ref 43.0–77.0)
Platelets: 201 10*3/uL (ref 150.0–400.0)
RBC: 4.59 Mil/uL (ref 3.87–5.11)
RDW: 13.5 % (ref 11.5–15.5)
WBC: 5 10*3/uL (ref 4.0–10.5)

## 2021-09-29 LAB — VITAMIN D 25 HYDROXY (VIT D DEFICIENCY, FRACTURES): VITD: 23.9 ng/mL — ABNORMAL LOW (ref 30.00–100.00)

## 2021-09-29 LAB — B12 AND FOLATE PANEL
Folate: >24.2 ng/mL (ref >5.9)
Vitamin B-12: 465 pg/mL (ref 211–911)

## 2021-09-30 ENCOUNTER — Other Ambulatory Visit: Payer: Self-pay | Admitting: Family

## 2021-09-30 DIAGNOSIS — E559 Vitamin D deficiency, unspecified: Secondary | ICD-10-CM

## 2021-09-30 MED ORDER — VITAMIN D (ERGOCALCIFEROL) 1.25 MG (50000 UNIT) PO CAPS: 50000.0000 [IU] | ORAL_CAPSULE | ORAL | 0 refills | Status: AC

## 2021-09-30 NOTE — Progress Notes (Signed)
Your vitamin D was a bit on the lower range. I will send an RX for vitamin D3 50,000 IU which you will take once weekly for 8 weeks. Once RX is complete, please continue over the counter Vitamin D3 1000 IU once daily. Return to the clinic for a follow up in three months to repeat your Vitamin D level.   Please continue to work on low cholesterol diet.  B12 with slight improvement, continue b12 1000 mcg once daily.

## 2022-03-30 ENCOUNTER — Ambulatory Visit: Payer: Managed Care, Other (non HMO) | Admitting: Family

## 2022-03-30 ENCOUNTER — Encounter: Payer: Self-pay | Admitting: Family

## 2022-03-30 VITALS — BP 116/74 | HR 70 | Temp 98.3°F | Ht 63.5 in | Wt 187.2 lb

## 2022-03-30 DIAGNOSIS — E559 Vitamin D deficiency, unspecified: Secondary | ICD-10-CM | POA: Diagnosis not present

## 2022-03-30 DIAGNOSIS — Z79899 Other long term (current) drug therapy: Secondary | ICD-10-CM

## 2022-03-30 DIAGNOSIS — Z6832 Body mass index (BMI) 32.0-32.9, adult: Secondary | ICD-10-CM

## 2022-03-30 DIAGNOSIS — E785 Hyperlipidemia, unspecified: Secondary | ICD-10-CM | POA: Diagnosis not present

## 2022-03-30 DIAGNOSIS — E661 Drug-induced obesity: Secondary | ICD-10-CM

## 2022-03-30 DIAGNOSIS — Z0000 Encounter for general adult medical examination without abnormal findings: Secondary | ICD-10-CM | POA: Insufficient documentation

## 2022-03-30 LAB — COMPREHENSIVE METABOLIC PANEL
ALT: 15 U/L (ref 0–35)
AST: 19 U/L (ref 0–37)
Albumin: 4.5 g/dL (ref 3.5–5.2)
Alkaline Phosphatase: 73 U/L (ref 39–117)
BUN: 18 mg/dL (ref 6–23)
CO2: 23 mEq/L (ref 19–32)
Calcium: 9.5 mg/dL (ref 8.4–10.5)
Chloride: 103 mEq/L (ref 96–112)
Creatinine, Ser: 0.78 mg/dL (ref 0.40–1.20)
GFR: 83.36 mL/min (ref >60.00)
Glucose, Bld: 101 mg/dL — ABNORMAL HIGH (ref 70–99)
Potassium: 4.4 mEq/L (ref 3.5–5.1)
Sodium: 137 mEq/L (ref 135–145)
Total Bilirubin: 0.4 mg/dL (ref 0.2–1.2)
Total Protein: 7.1 g/dL (ref 6.0–8.3)

## 2022-03-30 LAB — LIPID PANEL
Cholesterol: 207 mg/dL — ABNORMAL HIGH (ref 0–200)
HDL: 55.3 mg/dL (ref >39.00)
LDL Cholesterol: 114 mg/dL — ABNORMAL HIGH (ref 0–99)
NonHDL: 152.15
Total CHOL/HDL Ratio: 4
Triglycerides: 192 mg/dL — ABNORMAL HIGH (ref 0.0–149.0)
VLDL: 38.4 mg/dL (ref 0.0–40.0)

## 2022-03-30 LAB — VITAMIN D 25 HYDROXY (VIT D DEFICIENCY, FRACTURES): VITD: 28.13 ng/mL — ABNORMAL LOW (ref 30.00–100.00)

## 2022-03-30 NOTE — Progress Notes (Signed)
Subjective:  Patient ID: Tonya Anderson, female    DOB: 12/29/1965  Age: 57 y.o. MRN: [id number]  Patient Care Team: Eugenia Pancoast, FNP as PCP - General (Family Medicine)   CC:  Chief Complaint  Patient presents with   Annual Exam    HPI Tonya Anderson is a 57 y.o. female who presents today for a well woman exam. She reports consuming a general diet. Home exercise routine includes walking 3 hrs per days. She generally feels well. She reports sleeping well. She does not have additional problems to discuss today.   Vision:Not within last year Dental:Receives regular dental care STD:The patient denies history of sexually transmitted disease.  Lung Cancer Screening with low-dose Chest CT: n/a - Adults age 58-80 who are current cigarette smokers or quit within the last 15 years. Must have 20 pack year history.  AAA Screening: n/a - Men age 24-75 who have ever smoked  Mammogram: 05/12/21, due every 18 months. She has one scheduled for in October  Last pap: 03/25/21 Colonoscopy:01/21/15 Bone density scan: 09/13/19. Due, will wait until she has her breast screening scheduled.   Pt is without acute concerns.   Advanced Directives Patient does advanced directives including DNR, living will, and healthcare power of attorney. She does have a copy in the electronic medical record.   DEPRESSION SCREENING    03/30/2022    8:19 AM 02/07/2021   11:43 AM 11/30/2019    8:59 AM 11/28/2018    8:22 AM 11/24/2017    2:41 PM 03/22/2017   11:00 AM 11/16/2016    9:18 AM  PHQ 2/9 Scores  PHQ - 2 Score 0 0 0 0 0 0 0  PHQ- 9 Score 0   0      HLD: lipitor 10 mg , tolerating well.  Lab Results  Component Value Date   CHOL 186 09/29/2021   HDL 50.10 09/29/2021   LDLCALC 104 (H) 09/29/2021   TRIG 161.0 (H) 09/29/2021   CHOLHDL 4 09/29/2021   Vitamin d def: completed RX dosing now on MVI and also daily vitamin D.   PMP : had early menopause.   ROS: Negative unless specifically indicated above in  HPI.    Current Outpatient Medications:    atorvastatin (LIPITOR) 10 MG tablet, Take 1 tablet (10 mg total) by mouth daily., Disp: 90 tablet, Rfl: 3   cholecalciferol (VITAMIN D3) 25 MCG (1000 UNIT) tablet, Take 2,000 Units by mouth daily., Disp: , Rfl:    meloxicam (MOBIC) 7.5 MG tablet, Take 1 tablet (7.5 mg total) by mouth daily. Prn arthralgia, Disp: 90 tablet, Rfl: 3   triamterene-hydrochlorothiazide (MAXZIDE-25) 37.5-25 MG tablet, TAKE 1 TABLET BY MOUTH 2 TIMES PER WEEK, Disp: 90 tablet, Rfl: 3    Objective:    BP 116/74   Pulse 70   Temp 98.3 F (36.8 C) (Oral)   Ht 5' 3.5" (1.613 m)   Wt 187 lb 3.2 oz (84.9 kg)   SpO2 98%   BMI 32.64 kg/m   BP Readings from Last 3 Encounters:  03/30/22 116/74  09/22/21 122/76  03/25/21 124/80      @PHYSEXAMBYAGE @  Physical Exam Exam conducted with a chaperone present.  Constitutional:      General: She is not in acute distress.    Appearance: Normal appearance. She is normal weight. She is not ill-appearing.  HENT:     Head: Normocephalic.     Right Ear: Tympanic membrane normal.     Left Ear: Tympanic membrane normal.  Nose: Nose normal.     Mouth/Throat:     Mouth: Mucous membranes are moist.  Eyes:     Extraocular Movements: Extraocular movements intact.     Pupils: Pupils are equal, round, and reactive to light.  Cardiovascular:     Rate and Rhythm: Normal rate and regular rhythm.  Pulmonary:     Effort: Pulmonary effort is normal.     Breath sounds: Normal breath sounds.  Abdominal:     General: Abdomen is flat. Bowel sounds are normal.     Palpations: Abdomen is soft.     Tenderness: There is no guarding or rebound.  Musculoskeletal:        General: Normal range of motion.     Cervical back: Normal range of motion.  Skin:    General: Skin is warm.     Capillary Refill: Capillary refill takes less than 2 seconds.  Neurological:     General: No focal deficit present.     Mental Status: She is alert.   Psychiatric:        Mood and Affect: Mood normal.        Behavior: Behavior normal.        Thought Content: Thought content normal.        Judgment: Judgment normal.          Assessment & Plan:  Class 1 drug-induced obesity without serious comorbidity with body mass index (BMI) of 32.0 to 32.9 in adult Assessment & Plan: Pt advised to work on diet and exercise as tolerated    Hyperlipidemia, unspecified hyperlipidemia type Assessment & Plan: Ordered lipid panel, pending results. Work on low cholesterol diet and exercise as tolerated   Orders: -     Lipid panel -     Comprehensive metabolic panel  Vitamin D deficiency Assessment & Plan: Ordered vitamin d pending results.    Orders: -     VITAMIN D 25 Hydroxy (Vit-D Deficiency, Fractures)  On statin therapy Assessment & Plan: Hep function panel ordered  Orders: -     Comprehensive metabolic panel  Encounter for general adult medical examination without abnormal findings Assessment & Plan: Patient Counseling(The following topics were reviewed):  Preventative care handout given to pt  Health maintenance and immunizations reviewed. Please refer to Health maintenance section. Pt advised on safe sex, wearing seatbelts in car, and proper nutrition labwork ordered today for annual Dental health: Discussed importance of regular tooth brushing, flossing, and dental visits.        Follow-up: Return in about 6 months (around 09/28/2022) for f/u cholesterol.   Eugenia Pancoast, FNP

## 2022-03-30 NOTE — Assessment & Plan Note (Signed)
Pt advised to work on diet and exercise as tolerated  

## 2022-03-30 NOTE — Assessment & Plan Note (Signed)
Hep function panel ordered  

## 2022-03-30 NOTE — Assessment & Plan Note (Signed)
Ordered vitamin d pending results.   

## 2022-03-30 NOTE — Assessment & Plan Note (Signed)
Ordered lipid panel, pending results. Work on low cholesterol diet and exercise as tolerated ? ?

## 2022-03-30 NOTE — Assessment & Plan Note (Signed)
Patient Counseling(The following topics were reviewed): ? Preventative care handout given to pt  ?Health maintenance and immunizations reviewed. Please refer to Health maintenance section. ?Pt advised on safe sex, wearing seatbelts in car, and proper nutrition ?labwork ordered today for annual ?Dental health: Discussed importance of regular tooth brushing, flossing, and dental visits. ? ? ?

## 2022-03-30 NOTE — Patient Instructions (Signed)
   Stop by the lab prior to leaving today. I will notify you of your results once received.   Recommendations on keeping yourself healthy:  - Exercise at least 30-45 minutes a day, 3-4 days a week.  - Eat a low-fat diet with lots of fruits and vegetables, up to 7-9 servings per day.  - Seatbelts can save your life. Wear them always.  - Smoke detectors on every level of your home, check batteries every year.  - Eye Doctor - have an eye exam every 1-2 years  - Safe sex - if you may be exposed to STDs, use a condom.  - Alcohol -  If you drink, do it moderately, less than 2 drinks per day.  - Health Care Power of Attorney. Choose someone to speak for you if you are not able.  - Depression is common in our stressful world.If you're feeling down or losing interest in things you normally enjoy, please come in for a visit.  - Violence - If anyone is threatening or hurting you, please call immediately.  Due to recent changes in healthcare laws, you may see results of your imaging and/or laboratory studies on MyChart before I have had a chance to review them.  I understand that in some cases there may be results that are confusing or concerning to you. Please understand that not all results are received at the same time and often I may need to interpret multiple results in order to provide you with the best plan of care or course of treatment. Therefore, I ask that you please give me 2 business days to thoroughly review all your results before contacting my office for clarification. Should we see a critical lab result, you will be contacted sooner.   I will see you again in one year for your annual comprehensive exam unless otherwise stated and or with acute concerns.  It was a pleasure seeing you today! Please do not hesitate to reach out with any questions and or concerns.  Regards,   Jalexus Brett    

## 2022-03-31 ENCOUNTER — Other Ambulatory Visit: Payer: Self-pay | Admitting: Family

## 2022-03-31 DIAGNOSIS — E559 Vitamin D deficiency, unspecified: Secondary | ICD-10-CM

## 2022-03-31 MED ORDER — VITAMIN D (ERGOCALCIFEROL) 1.25 MG (50000 UNIT) PO CAPS: 50000.0000 [IU] | ORAL_CAPSULE | ORAL | 0 refills | Status: DC

## 2022-06-19 ENCOUNTER — Other Ambulatory Visit: Payer: Self-pay | Admitting: Family

## 2022-06-19 DIAGNOSIS — M2550 Pain in unspecified joint: Secondary | ICD-10-CM

## 2022-06-19 NOTE — Telephone Encounter (Signed)
meloxicam (MOBIC) 7.5 MG tablet   LR- 09/22/21 ( 90 tabs/ 3 refills) LV- 03/30/22 NV- 09/28/22

## 2022-09-28 ENCOUNTER — Ambulatory Visit: Payer: Managed Care, Other (non HMO) | Admitting: Family

## 2022-10-01 ENCOUNTER — Ambulatory Visit: Payer: Managed Care, Other (non HMO) | Admitting: Family

## 2022-10-08 ENCOUNTER — Ambulatory Visit: Payer: Managed Care, Other (non HMO) | Admitting: Family

## 2022-10-08 VITALS — BP 122/68 | HR 59 | Temp 98.0°F | Ht 63.5 in | Wt 188.8 lb

## 2022-10-08 DIAGNOSIS — H8103 Meniere's disease, bilateral: Secondary | ICD-10-CM

## 2022-10-08 DIAGNOSIS — E785 Hyperlipidemia, unspecified: Secondary | ICD-10-CM | POA: Diagnosis not present

## 2022-10-08 DIAGNOSIS — Z79899 Other long term (current) drug therapy: Secondary | ICD-10-CM | POA: Diagnosis not present

## 2022-10-08 DIAGNOSIS — Z6832 Body mass index (BMI) 32.0-32.9, adult: Secondary | ICD-10-CM

## 2022-10-08 DIAGNOSIS — R7309 Other abnormal glucose: Secondary | ICD-10-CM

## 2022-10-08 DIAGNOSIS — E661 Drug-induced obesity: Secondary | ICD-10-CM

## 2022-10-08 DIAGNOSIS — I10 Essential (primary) hypertension: Secondary | ICD-10-CM

## 2022-10-08 DIAGNOSIS — E559 Vitamin D deficiency, unspecified: Secondary | ICD-10-CM

## 2022-10-08 DIAGNOSIS — B070 Plantar wart: Secondary | ICD-10-CM | POA: Diagnosis not present

## 2022-10-08 DIAGNOSIS — Z1231 Encounter for screening mammogram for malignant neoplasm of breast: Secondary | ICD-10-CM

## 2022-10-08 LAB — HEPATIC FUNCTION PANEL
ALT: 17 U/L (ref 0–35)
AST: 17 U/L (ref 0–37)
Albumin: 4.7 g/dL (ref 3.5–5.2)
Alkaline Phosphatase: 77 U/L (ref 39–117)
Bilirubin, Direct: 0.1 mg/dL (ref 0.0–0.3)
Total Bilirubin: 0.5 mg/dL (ref 0.2–1.2)
Total Protein: 7.2 g/dL (ref 6.0–8.3)

## 2022-10-08 LAB — LIPID PANEL
Cholesterol: 195 mg/dL (ref 0–200)
HDL: 53.2 mg/dL (ref >39.00)
NonHDL: 141.99
Total CHOL/HDL Ratio: 4
Triglycerides: 255 mg/dL — ABNORMAL HIGH (ref 0.0–149.0)
VLDL: 51 mg/dL — ABNORMAL HIGH (ref 0.0–40.0)

## 2022-10-08 LAB — LDL CHOLESTEROL, DIRECT: Direct LDL: 127 mg/dL

## 2022-10-08 LAB — VITAMIN D 25 HYDROXY (VIT D DEFICIENCY, FRACTURES): VITD: 31.97 ng/mL (ref 30.00–100.00)

## 2022-10-08 LAB — HEMOGLOBIN A1C: Hgb A1c MFr Bld: 5.8 % (ref 4.6–6.5)

## 2022-10-08 NOTE — Patient Instructions (Signed)
  I have sent an electronic order over to your preferred location for the following:   []   2D Mammogram  [x]   3D Mammogram  []   Bone Density   Please give this center a call to get scheduled at your convenience.  [x]   [facility]  [address] [place] [address]  [phone]  Make sure to wear two piece  clothing  No lotions powders or deodorants the day of the appointment Make sure to bring picture ID and insurance card.  Bring list of medications you are currently taking including any supplements.    ------------------------------------

## 2022-10-08 NOTE — Assessment & Plan Note (Signed)
Pt advised to work on diet and exercise as tolerated  

## 2022-10-08 NOTE — Assessment & Plan Note (Signed)
Ordered lipid panel, pending results. Work on low cholesterol diet and exercise as tolerated Continue atorvastatin 10 mg nightly

## 2022-10-08 NOTE — Assessment & Plan Note (Signed)
Ordered vitamin d pending results.  Continue otc supplementation 

## 2022-10-08 NOTE — Assessment & Plan Note (Signed)
Procedure in office for cryotherapy. Sanitized area surrounding plantar wart in office, used small scalpel to trim excess callous and then applied cryotherapy for approximately 10 seconds. Pt advised blister may develop, however monitor for worsening signs/symptoms of infection to include: increasing redness, increasing tenderness, increase in size, and or pustulant drainage from site. If this is to occur please let me know immediately.

## 2022-10-08 NOTE — Assessment & Plan Note (Signed)
A1c today pending results

## 2022-10-08 NOTE — Progress Notes (Signed)
Established Patient Office Visit  Subjective:      CC:  Chief Complaint  Patient presents with   Medical Management of Chronic Issues    HPI: Tonya Anderson is a 57 y.o. female presenting on 10/08/2022 for Medical Management of Chronic Issues .  Lab Results  Component Value Date   CHOL 207 (H) 03/30/2022   HDL 55.30 03/30/2022   LDLCALC 114 (H) 03/30/2022   TRIG 192.0 (H) 03/30/2022   CHOLHDL 4 03/30/2022   HLD: on atorvastatin 10 mg nightly. Tolerating well.  Working on low cholesterol diet as best she can.  Doesn't eat a lot of red meats, probably no more than twice a week.  Limited cheese not really butter use.   HTN: stable good today 122/68.  On maxzide 25 , doing well.   Vitamin d def: taking MVI plus additional D3 once daily of 2000 I/U Last vitamin D Lab Results  Component Value Date   VD25OH 28.13 (L) 03/30/2022   Vitamin b12 def: MVI daily  Lab Results  Component Value Date   VITAMINB12 465 09/29/2021   Plantar painful on left lower lateral side. Seems to get this every summer.  Did not file it at home.    Social history:  Relevant past medical, surgical, family and social history reviewed and updated as indicated. Interim medical history since our last visit reviewed.  Allergies and medications reviewed and updated.  DATA REVIEWED: CHART IN EPIC     ROS: Negative unless specifically indicated above in HPI.    Current Outpatient Medications:    cholecalciferol (VITAMIN D3) 25 MCG (1000 UNIT) tablet, Take 2,000 Units by mouth daily., Disp: , Rfl:    Magnesium 500 MG CAPS, Take 1 capsule by mouth daily., Disp: , Rfl:    meloxicam (MOBIC) 7.5 MG tablet, TAKE 1 TABLET BY MOUTH DAILY AS NEEDED FOR ARTHRALGIA, Disp: 90 tablet, Rfl: 3   triamterene-hydrochlorothiazide (MAXZIDE-25) 37.5-25 MG tablet, TAKE 1 TABLET BY MOUTH 2 TIMES PER WEEK, Disp: 90 tablet, Rfl: 3   atorvastatin (LIPITOR) 10 MG tablet, Take 1 tablet (10 mg total) by mouth daily.,  Disp: 90 tablet, Rfl: 3      Objective:    BP 122/68   Pulse (!) 59   Temp 98 F (36.7 C) (Oral)   Ht 5' 3.5" (1.613 m)   Wt 188 lb 12.8 oz (85.6 kg)   SpO2 98%   BMI 32.92 kg/m   Wt Readings from Last 3 Encounters:  10/08/22 188 lb 12.8 oz (85.6 kg)  03/30/22 187 lb 3.2 oz (84.9 kg)  09/22/21 185 lb (83.9 kg)    Physical Exam Constitutional:      General: She is not in acute distress.    Appearance: Normal appearance. She is normal weight. She is not ill-appearing, toxic-appearing or diaphoretic.  HENT:     Head: Normocephalic.  Cardiovascular:     Rate and Rhythm: Normal rate and regular rhythm.  Pulmonary:     Effort: Pulmonary effort is normal.     Breath sounds: Normal breath sounds.  Musculoskeletal:        General: Normal range of motion.       Feet:  Feet:     Comments: Small annular plantar wart, nontender, left lateral base of superior foot Neurological:     General: No focal deficit present.     Mental Status: She is alert and oriented to person, place, and time. Mental status is at baseline.  Psychiatric:  Mood and Affect: Mood normal.        Behavior: Behavior normal.        Thought Content: Thought content normal.        Judgment: Judgment normal.           Assessment & Plan:  Hyperlipidemia, unspecified hyperlipidemia type Assessment & Plan: Ordered lipid panel, pending results. Work on low cholesterol diet and exercise as tolerated Continue atorvastatin 10 mg nightly    Orders: -     Lipid panel  Elevated glucose Assessment & Plan: A1c today pending results.    Orders: -     Hemoglobin A1c  Plantar wart of left foot Assessment & Plan: Procedure in office for cryotherapy. Sanitized area surrounding plantar wart in office, used small scalpel to trim excess callous and then applied cryotherapy for approximately 10 seconds. Pt advised blister may develop, however monitor for worsening signs/symptoms of infection to include:  increasing redness, increasing tenderness, increase in size, and or pustulant drainage from site. If this is to occur please let me know immediately.       On statin therapy -     Hepatic function panel  Vitamin D deficiency Assessment & Plan: Ordered vitamin d pending results.  Continue otc supplementation.    Orders: -     VITAMIN D 25 Hydroxy (Vit-D Deficiency, Fractures)  Primary hypertension  Meniere's disease, bilateral  Screening mammogram for breast cancer -     3D Screening Mammogram, Left and Right; Future  Class 1 drug-induced obesity without serious comorbidity with body mass index (BMI) of 32.0 to 32.9 in adult Assessment & Plan: Pt advised to work on diet and exercise as tolerated       Return in about 1 year (around 10/08/2023) for f/u CPE.  Mort Sawyers, MSN, APRN, FNP-C [facility]

## 2022-10-09 ENCOUNTER — Other Ambulatory Visit: Payer: Self-pay | Admitting: Family

## 2022-10-09 DIAGNOSIS — E782 Mixed hyperlipidemia: Secondary | ICD-10-CM

## 2022-10-09 MED ORDER — ATORVASTATIN CALCIUM 20 MG PO TABS
20.0000 mg | ORAL_TABLET | Freq: Every day | ORAL | 3 refills | Status: DC
Start: 2022-10-09 — End: 2023-10-04

## 2022-10-12 ENCOUNTER — Ambulatory Visit: Payer: Managed Care, Other (non HMO) | Admitting: Family

## 2022-12-03 ENCOUNTER — Ambulatory Visit
Admission: RE | Admit: 2022-12-03 | Discharge: 2022-12-03 | Disposition: A | Discharge status: Home / Self Care | Payer: Managed Care, Other (non HMO) | Source: Ambulatory Visit | Attending: Family | Admitting: Family

## 2022-12-03 DIAGNOSIS — Z1231 Encounter for screening mammogram for malignant neoplasm of breast: Secondary | ICD-10-CM | POA: Diagnosis present

## 2022-12-08 ENCOUNTER — Other Ambulatory Visit: Payer: Self-pay | Admitting: Family

## 2022-12-08 DIAGNOSIS — R928 Other abnormal and inconclusive findings on diagnostic imaging of breast: Secondary | ICD-10-CM

## 2022-12-10 ENCOUNTER — Encounter: Payer: Self-pay | Admitting: Family

## 2023-02-12 ENCOUNTER — Ambulatory Visit
Admission: RE | Admit: 2023-02-12 | Discharge: 2023-02-12 | Disposition: A | Discharge status: Home / Self Care | Payer: Managed Care, Other (non HMO) | Source: Ambulatory Visit | Attending: Family | Admitting: Family

## 2023-02-12 DIAGNOSIS — R928 Other abnormal and inconclusive findings on diagnostic imaging of breast: Secondary | ICD-10-CM | POA: Insufficient documentation

## 2023-02-12 NOTE — Progress Notes (Signed)
Reviewed recent mammogram report.  Per radiologist and breast center, additional imaging to be ordered in six months.

## 2023-02-12 NOTE — Progress Notes (Signed)
noted 

## 2023-03-28 ENCOUNTER — Encounter: Payer: Self-pay | Admitting: Family

## 2023-04-01 ENCOUNTER — Encounter: Payer: Managed Care, Other (non HMO) | Admitting: Family

## 2023-06-28 ENCOUNTER — Other Ambulatory Visit: Payer: Self-pay | Admitting: Family

## 2023-06-28 DIAGNOSIS — N63 Unspecified lump in unspecified breast: Secondary | ICD-10-CM

## 2023-07-27 ENCOUNTER — Encounter: Payer: Self-pay | Admitting: Family

## 2023-08-18 ENCOUNTER — Ambulatory Visit
Admission: RE | Admit: 2023-08-18 | Discharge: 2023-08-18 | Disposition: A | Discharge status: Home / Self Care | Source: Ambulatory Visit | Attending: Family | Admitting: Family

## 2023-08-18 ENCOUNTER — Ambulatory Visit: Payer: Self-pay | Admitting: Family

## 2023-08-18 DIAGNOSIS — N63 Unspecified lump in unspecified breast: Secondary | ICD-10-CM | POA: Diagnosis present

## 2023-08-18 DIAGNOSIS — N6321 Unspecified lump in the left breast, upper outer quadrant: Secondary | ICD-10-CM | POA: Insufficient documentation

## 2023-08-28 ENCOUNTER — Other Ambulatory Visit: Payer: Self-pay | Admitting: Family

## 2023-08-28 DIAGNOSIS — M2550 Pain in unspecified joint: Secondary | ICD-10-CM

## 2023-10-03 ENCOUNTER — Other Ambulatory Visit: Payer: Self-pay | Admitting: Family

## 2023-10-03 DIAGNOSIS — E782 Mixed hyperlipidemia: Secondary | ICD-10-CM

## 2023-10-05 ENCOUNTER — Encounter: Payer: Self-pay | Admitting: Family

## 2023-10-12 ENCOUNTER — Ambulatory Visit (INDEPENDENT_AMBULATORY_CARE_PROVIDER_SITE_OTHER): Payer: Managed Care, Other (non HMO) | Admitting: Family

## 2023-10-12 ENCOUNTER — Ambulatory Visit: Payer: Self-pay | Admitting: Family

## 2023-10-12 ENCOUNTER — Encounter: Payer: Self-pay | Admitting: Family

## 2023-10-12 ENCOUNTER — Other Ambulatory Visit: Payer: Self-pay | Admitting: Family

## 2023-10-12 VITALS — BP 126/86 | HR 71 | Temp 98.4°F | Ht 63.5 in | Wt 185.4 lb

## 2023-10-12 DIAGNOSIS — Z79899 Other long term (current) drug therapy: Secondary | ICD-10-CM

## 2023-10-12 DIAGNOSIS — N6321 Unspecified lump in the left breast, upper outer quadrant: Secondary | ICD-10-CM | POA: Insufficient documentation

## 2023-10-12 DIAGNOSIS — E538 Deficiency of other specified B group vitamins: Secondary | ICD-10-CM

## 2023-10-12 DIAGNOSIS — E782 Mixed hyperlipidemia: Secondary | ICD-10-CM

## 2023-10-12 DIAGNOSIS — R7303 Prediabetes: Secondary | ICD-10-CM | POA: Insufficient documentation

## 2023-10-12 DIAGNOSIS — Z23 Encounter for immunization: Secondary | ICD-10-CM

## 2023-10-12 DIAGNOSIS — M778 Other enthesopathies, not elsewhere classified: Secondary | ICD-10-CM | POA: Insufficient documentation

## 2023-10-12 DIAGNOSIS — E559 Vitamin D deficiency, unspecified: Secondary | ICD-10-CM | POA: Diagnosis not present

## 2023-10-12 DIAGNOSIS — R2231 Localized swelling, mass and lump, right upper limb: Secondary | ICD-10-CM | POA: Insufficient documentation

## 2023-10-12 DIAGNOSIS — Z0001 Encounter for general adult medical examination with abnormal findings: Secondary | ICD-10-CM | POA: Diagnosis not present

## 2023-10-12 DIAGNOSIS — S40861A Insect bite (nonvenomous) of right upper arm, initial encounter: Secondary | ICD-10-CM | POA: Insufficient documentation

## 2023-10-12 DIAGNOSIS — W57XXXA Bitten or stung by nonvenomous insect and other nonvenomous arthropods, initial encounter: Secondary | ICD-10-CM

## 2023-10-12 DIAGNOSIS — E66811 Obesity, class 1: Secondary | ICD-10-CM

## 2023-10-12 DIAGNOSIS — H8109 Meniere's disease, unspecified ear: Secondary | ICD-10-CM

## 2023-10-12 DIAGNOSIS — R928 Other abnormal and inconclusive findings on diagnostic imaging of breast: Secondary | ICD-10-CM

## 2023-10-12 LAB — LIPID PANEL
Cholesterol: 192 mg/dL (ref 0–200)
HDL: 55.1 mg/dL (ref >39.00)
LDL Cholesterol: 110 mg/dL — ABNORMAL HIGH (ref 0–99)
NonHDL: 137.37
Total CHOL/HDL Ratio: 3
Triglycerides: 135 mg/dL (ref 0.0–149.0)
VLDL: 27 mg/dL (ref 0.0–40.0)

## 2023-10-12 LAB — BASIC METABOLIC PANEL WITH GFR
BUN: 14 mg/dL (ref 6–23)
CO2: 28 meq/L (ref 19–32)
Calcium: 9.4 mg/dL (ref 8.4–10.5)
Chloride: 104 meq/L (ref 96–112)
Creatinine, Ser: 0.81 mg/dL (ref 0.40–1.20)
GFR: 78.81 mL/min (ref >60.00)
Glucose, Bld: 95 mg/dL (ref 70–99)
Potassium: 4.9 meq/L (ref 3.5–5.1)
Sodium: 140 meq/L (ref 135–145)

## 2023-10-12 LAB — VITAMIN B12: Vitamin B-12: 496 pg/mL (ref 211–911)

## 2023-10-12 LAB — HEMOGLOBIN A1C: Hgb A1c MFr Bld: 6 % (ref 4.6–6.5)

## 2023-10-12 LAB — CBC
HCT: 43.4 % (ref 36.0–46.0)
Hemoglobin: 14.1 g/dL (ref 12.0–15.0)
MCHC: 32.6 g/dL (ref 30.0–36.0)
MCV: 90.6 fl (ref 78.0–100.0)
Platelets: 207 K/uL (ref 150.0–400.0)
RBC: 4.79 Mil/uL (ref 3.87–5.11)
RDW: 13.6 % (ref 11.5–15.5)
WBC: 4.8 K/uL (ref 4.0–10.5)

## 2023-10-12 LAB — VITAMIN D 25 HYDROXY (VIT D DEFICIENCY, FRACTURES): VITD: 32.92 ng/mL (ref 30.00–100.00)

## 2023-10-12 MED ORDER — TRIAMTERENE-HCTZ 37.5-25 MG PO TABS: ORAL_TABLET | ORAL | 3 refills | Status: AC

## 2023-10-12 NOTE — Progress Notes (Signed)
 [email or web address]  Subjective:  Patient ID: Tonya Anderson, female    DOB: 18-May-1965  Age: 58 y.o. MRN: [id number]  Patient Care Team: Corwin Antu, FNP as PCP - General (Family Medicine)   CC:  Chief Complaint  Patient presents with   Annual Exam    HPI Tonya Anderson is a 58 y.o. female who presents today for an annual physical exam. She reports consuming a general diet. Mix of weights and cardio  She generally feels well. She reports sleeping fairly well. She does have additional problems to discuss today.   Vision:Within last year Dental:Receives regular dental care  Mammogram: 08/18/23  Last pap: Mar 25 2021 negative every five years  Colonoscopy:01/20/25 every ten years   Pt is with acute concerns.   Discussed the use of AI scribe software for clinical note transcription with the patient, who gave verbal consent to proceed.  History of Present Illness Jelissa Espiritu is a 58 year old female who presents for an annual physical exam.  She experiences intermittent pain on the right side of her wrist, which began approximately six weeks ago after bending her wrist awkwardly while with her grandchild. The pain is described as a 'hot shooting' sensation that occurs two to three times a week, typically triggered by certain wrist movements such as drying her hand on a towel. Despite the pain, she maintains full grip strength and continues to lift weights without issue. She has not used any wrist braces or taken medications like ibuprofen or Tylenol for this issue, as the pain is brief and resolves quickly.  She mentions a tick bite on her right upper arm that occurred two and a half weeks ago. No bull's-eye rash or symptoms such as increased fatigue or lethargy, although she notes feeling tired after having her grandchild for the weekend.  She discusses her breast health, noting a 0.2 cm oval circumscribed mass in the upper outer quadrant of her left breast, which was unchanged from a previous  mammogram in December 2024. She had a diagnostic mammogram in June 2025 and is scheduled for another in October 2025. No new breast rashes, increased size, tenderness, nipple discharge, inversion, or dimpling.  She takes a multivitamin containing 1000 IU of vitamin D  and an additional supplement, possibly totaling 2000 IU of vitamin D  daily. She is not on lithium but inquires about testing her lithium levels due to an article she read linking low lithium levels with dementia and Alzheimer's, especially in those on diuretics like her low-dose Maxzide. She takes magnesium supplements to prevent her hands from 'going to sleep,' which she feels is effective. She is interested in potential new GLP-1 medications for weight management, as she prefers not to use injectable forms.   Advanced Directives Patient does not have advanced directives   DEPRESSION SCREENING    10/12/2023    8:42 AM 10/08/2022   12:29 PM 03/30/2022    8:19 AM 02/07/2021   11:43 AM 11/30/2019    8:59 AM 11/28/2018    8:22 AM 11/24/2017    2:41 PM  PHQ 2/9 Scores  PHQ - 2 Score 0 0 0 0 0 0 0  PHQ- 9 Score 0 0 0   0      ROS: Negative unless specifically indicated above in HPI.    Current Outpatient Medications:    atorvastatin  (LIPITOR) 20 MG tablet, TAKE 1 TABLET BY MOUTH EVERY DAY, Disp: 90 tablet, Rfl: 3   cholecalciferol (VITAMIN D3) 25 MCG (1000 UNIT) tablet, Take 2,000  Units by mouth daily., Disp: , Rfl:    Magnesium 500 MG CAPS, Take 1 capsule by mouth daily., Disp: , Rfl:    meloxicam  (MOBIC ) 7.5 MG tablet, TAKE 1 TABLET BY MOUTH DAILY AS NEEDED FOR ARTHRALGIA, Disp: 90 tablet, Rfl: 3   triamterene -hydrochlorothiazide (MAXZIDE-25) 37.5-25 MG tablet, TAKE 1 TABLET BY MOUTH 2 TIMES PER WEEK, Disp: 90 tablet, Rfl: 3    Objective:    BP 126/86 (BP Location: Left Arm, Patient Position: Sitting, Cuff Size: Normal)   Pulse 71   Temp 98.4 F (36.9 C) (Temporal)   Ht 5' 3.5 (1.613 m)   Wt 185 lb 6.4 oz (84.1 kg)    SpO2 98%   BMI 32.33 kg/m   BP Readings from Last 3 Encounters:  10/12/23 126/86  10/08/22 122/68  03/30/22 116/74     Physical Exam Constitutional:      General: She is not in acute distress.    Appearance: Normal appearance. She is obese. She is not ill-appearing.  HENT:     Head: Normocephalic.     Right Ear: Tympanic membrane normal.     Left Ear: Tympanic membrane normal.     Nose: Nose normal.     Mouth/Throat:     Mouth: Mucous membranes are moist.  Eyes:     Extraocular Movements: Extraocular movements intact.     Pupils: Pupils are equal, round, and reactive to light.  Cardiovascular:     Rate and Rhythm: Normal rate and regular rhythm.  Pulmonary:     Effort: Pulmonary effort is normal.     Breath sounds: Normal breath sounds.  Abdominal:     General: Abdomen is flat. Bowel sounds are normal.     Palpations: Abdomen is soft.     Tenderness: There is no guarding or rebound.  Musculoskeletal:        General: Normal range of motion.     Right wrist: No deformity, effusion, bony tenderness or snuff box tenderness. Normal range of motion.     Cervical back: Normal range of motion.     Comments: Palpable small boggy mobile mass right wrist lateral side  Skin:    General: Skin is warm.     Capillary Refill: Capillary refill takes less than 2 seconds.  Neurological:     General: No focal deficit present.     Mental Status: She is alert.  Psychiatric:        Mood and Affect: Mood normal.        Behavior: Behavior normal.        Thought Content: Thought content normal.        Judgment: Judgment normal.     Physical Exam CHEST: Lungs clear to auscultation, no wheezing. CARDIOVASCULAR: Heart regular rate and rhythm, normal heart sounds. EXTREMITIES: No edema in lower extremities. MUSCULOSKELETAL: No tenderness at the base of the wrist. No pain in the right wrist with fist making, flexion, or extension. No tenderness in the right anatomical snuffbox. No pain or  tingling with wrist compression test. Possible small ganglion cyst in the right wrist.   Results RADIOLOGY Mammogram: 0.2 cm oval circumscribed mass in the upper outer quadrant of the left breast, unchanged since 02/12/2023, probably benign, no suspicious architectural distortion or microcalcifications (08/18/2023)      Assessment & Plan:   Assessment and Plan Assessment & Plan Right wrist mass, possible ganglion cyst Intermittent shooting pain in the right wrist, likely related to a ganglion cyst. No significant pain with grip  or range of motion. Possible inflammation or ganglion cyst noted on examination. - Order ultrasound of right wrist to evaluate for ganglion cyst - Advise wearing a wrist brace at night to promote immobility - Recommend avoiding exercises that exacerbate wrist pain - Suggest using heat or ice for symptomatic relief - Consider using Voltaren gel for localized pain relief  Left breast mass, probably benign, under surveillance 0.2 cm oval circumscribed mass in the upper outer quadrant of the left breast, unchanged since December 2024. No suspicious features noted. Under close surveillance with diagnostic mammograms every 4 months. - Schedule bilateral diagnostic mammogram in October 2025 - Monitor for any new symptoms such as breast rash, increased size, tenderness, nipple discharge, or dimpling  Hyperlipidemia Current medication regimen includes atorvastatin . - Continue atorvastatin  as prescribed  Obesity Discussion about potential future use of GLP-1 receptor agonist pills for weight management. She is interested in non-injection options. Insurance coverage and alternative medications considered. - Investigate insurance coverage for GLP-1 receptor agonist pills - Consider alternative weight loss medications if GLP-1 is not covered  Recording duration: 28 minutes  Encounter general adult exam with abn findings  Patient Counseling(The following topics were  reviewed):  Preventative care handout given to pt  Health maintenance and immunizations reviewed. Please refer to Health maintenance section. Pt advised on safe sex, wearing seatbelts in car, and proper nutrition labwork ordered today for annual Dental health: Discussed importance of regular tooth brushing, flossing, and dental visits. Additional concerns acute concerns evaluation of those acute concerns total 22 additional minutes     Follow-up: Return in about 1 year (around 10/11/2024) for f/u CPE.   Ginger Patrick, FNP

## 2023-10-12 NOTE — Patient Instructions (Signed)
 I have sent in your order electronically for the following: u/s right wrist  at this location below. Please call to schedule the appointment at your convenience  [facility] outpatient imaging center off [address], [place] [address] Phone [phone]  8-5 pm

## 2023-10-14 ENCOUNTER — Ambulatory Visit (INDEPENDENT_AMBULATORY_CARE_PROVIDER_SITE_OTHER)

## 2023-10-14 DIAGNOSIS — E66811 Obesity, class 1: Secondary | ICD-10-CM | POA: Diagnosis not present

## 2023-10-14 DIAGNOSIS — Z6832 Body mass index (BMI) 32.0-32.9, adult: Secondary | ICD-10-CM

## 2023-10-14 DIAGNOSIS — E661 Drug-induced obesity: Secondary | ICD-10-CM | POA: Diagnosis not present

## 2023-10-14 LAB — LITHIUM LEVEL: Lithium Lvl: <0.3 mmol/L — ABNORMAL LOW (ref 0.6–1.2)

## 2023-10-14 LAB — TSH: TSH: 4.01 u[IU]/mL (ref 0.35–5.50)

## 2023-10-14 NOTE — Addendum Note (Signed)
 Addended by: CORWIN ANTU on: 10/14/2023 12:11 PM   Modules accepted: Orders

## 2023-10-21 ENCOUNTER — Ambulatory Visit: Payer: Self-pay | Admitting: Family

## 2023-10-21 NOTE — Progress Notes (Signed)
 noted

## 2023-10-28 ENCOUNTER — Encounter: Payer: Self-pay | Admitting: *Deleted

## 2024-03-06 ENCOUNTER — Encounter: Payer: Self-pay | Admitting: Family

## 2024-04-03 ENCOUNTER — Telehealth: Payer: Self-pay | Admitting: Family

## 2024-04-03 ENCOUNTER — Telehealth: Admitting: Family

## 2024-04-03 ENCOUNTER — Encounter: Payer: Self-pay | Admitting: Family

## 2024-04-03 VITALS — Ht 64.0 in | Wt 188.0 lb

## 2024-04-03 DIAGNOSIS — E66811 Obesity, class 1: Secondary | ICD-10-CM

## 2024-04-03 DIAGNOSIS — E782 Mixed hyperlipidemia: Secondary | ICD-10-CM

## 2024-04-03 DIAGNOSIS — R7303 Prediabetes: Secondary | ICD-10-CM

## 2024-04-03 DIAGNOSIS — Z6832 Body mass index (BMI) 32.0-32.9, adult: Secondary | ICD-10-CM

## 2024-04-03 DIAGNOSIS — E661 Drug-induced obesity: Secondary | ICD-10-CM

## 2024-04-03 MED ORDER — WEGOVY 1.5 MG PO TABS: 1.5000 mg | ORAL_TABLET | Freq: Every day | ORAL | 0 refills | Status: DC

## 2024-04-03 NOTE — Telephone Encounter (Signed)
 Can we start p/a for wegovy  pill

## 2024-04-03 NOTE — Progress Notes (Unsigned)
 "  Virtual Visit via Video note  I connected with Tonya Anderson on 04/03/24 at home by video and verified that I am speaking with the correct person using two identifiers.The provider, Ginger Patrick, FNP is located in their home at time of visit.  I discussed the limitations, risks, security and privacy concerns of performing an evaluation and management service by video and the availability of in person appointments. I also discussed with the patient that there may be a patient responsible charge related to this service. The patient expressed understanding and agreed to proceed.  Subjective: PCP: Patrick Ginger, FNP  Chief Complaint  Patient presents with   Medical Management of Chronic Issues    Discuss oral glp1 medications.     Discussed the use of AI scribe software for clinical note transcription with the patient, who gave verbal consent to proceed.  History of Present Illness    ROS: Per HPI Current Medications[1]  Observations/Objective: Physical Exam Vitals reviewed.  Constitutional:      General: She is not in acute distress.    Appearance: Normal appearance. She is not ill-appearing.  Pulmonary:     Effort: Pulmonary effort is normal.  Neurological:     General: No focal deficit present.     Mental Status: She is alert and oriented to person, place, and time.  Psychiatric:        Mood and Affect: Mood normal.        Behavior: Behavior normal.        Thought Content: Thought content normal.     Assessment and Plan Assessment & Plan   - Monitor weight and side effects - Ensure adequate protein intake and consider protein shakes if appetite decreases. - Encourage hydration and consider mild laxatives like Miralax if constipation occurs. - No family history of MEN syndrome 2A or medullary thyroid  cancer  -constipation at times but overall manageable with daily fiber pill and one prune daily  -Advised her to monitor for s/e    Follow Up  Instructions: Return in about 3 months (around 07/01/2024) for f/u weight loss medication.   I discussed the assessment and treatment plan with the patient. The patient was provided an opportunity to ask questions and all were answered. The patient agreed with the plan and demonstrated an understanding of the instructions.   The patient was advised to call back or seek an in-person evaluation if the symptoms worsen or if the condition fails to improve as anticipated.  The above assessment and management plan was discussed with the patient. The patient verbalized understanding of and has agreed to the management plan. Patient is aware to call the clinic if symptoms persist or worsen. Patient is aware when to return to the clinic for a follow-up visit. Patient educated on when it is appropriate to go to the emergency department.     Ginger Patrick, MSN, APRN, FNP-C [facility]      [1]  Current Outpatient Medications:    atorvastatin  (LIPITOR) 20 MG tablet, TAKE 1 TABLET BY MOUTH EVERY DAY, Disp: 90 tablet, Rfl: 3   cholecalciferol (VITAMIN D3) 25 MCG (1000 UNIT) tablet, Take 2,000 Units by mouth daily., Disp: , Rfl:    Magnesium 500 MG CAPS, Take 1 capsule by mouth daily., Disp: , Rfl:    meloxicam  (MOBIC ) 7.5 MG tablet, TAKE 1 TABLET BY MOUTH DAILY AS NEEDED FOR ARTHRALGIA, Disp: 90 tablet, Rfl: 3   semaglutide -weight management (WEGOVY ) 1.5 MG tablet, Take 1 tablet (  1.5 mg total) by mouth daily. Daily in AM on an empty stomach with 4 oz of water. Do not eat or drink for 30 minutes after dose., Disp: 30 tablet, Rfl: 0   triamterene -hydrochlorothiazide (MAXZIDE-25) 37.5-25 MG tablet, TAKE 1 TABLET BY MOUTH 2 TIMES PER WEEK, Disp: 90 tablet, Rfl: 3 "

## 2024-04-04 MED ORDER — WEGOVY 1.5 MG PO TABS: 1.5000 mg | ORAL_TABLET | Freq: Every day | ORAL | 0 refills | Status: AC
